# Patient Record
Sex: Male | Born: 1990 | Race: White | Hispanic: No | Marital: Single | Smoking: Never smoker
Health system: Southern US, Community
[De-identification: ages and names within clinical notes are randomized; demographics above are authoritative.]

## PROBLEM LIST (undated history)

## (undated) DIAGNOSIS — I4819 Other persistent atrial fibrillation: Secondary | ICD-10-CM

## (undated) DIAGNOSIS — K5190 Ulcerative colitis, unspecified, without complications: Secondary | ICD-10-CM

## (undated) DIAGNOSIS — F1110 Opioid abuse, uncomplicated: Secondary | ICD-10-CM

## (undated) HISTORY — DX: Other persistent atrial fibrillation: I48.19

---

## 2011-12-18 ENCOUNTER — Emergency Department (HOSPITAL_COMMUNITY): Payer: Worker's Compensation

## 2011-12-18 ENCOUNTER — Encounter (HOSPITAL_COMMUNITY): Payer: Self-pay | Admitting: Physical Medicine and Rehabilitation

## 2011-12-18 ENCOUNTER — Emergency Department (HOSPITAL_COMMUNITY)
Admission: EM | Admit: 2011-12-18 | Discharge: 2011-12-18 | Disposition: A | Discharge status: Home / Self Care | Payer: Worker's Compensation | Attending: Emergency Medicine | Admitting: Emergency Medicine

## 2011-12-18 DIAGNOSIS — Y939 Activity, unspecified: Secondary | ICD-10-CM | POA: Insufficient documentation

## 2011-12-18 DIAGNOSIS — Y929 Unspecified place or not applicable: Secondary | ICD-10-CM | POA: Insufficient documentation

## 2011-12-18 DIAGNOSIS — Z79899 Other long term (current) drug therapy: Secondary | ICD-10-CM | POA: Insufficient documentation

## 2011-12-18 DIAGNOSIS — F1210 Cannabis abuse, uncomplicated: Secondary | ICD-10-CM | POA: Insufficient documentation

## 2011-12-18 DIAGNOSIS — K5190 Ulcerative colitis, unspecified, without complications: Secondary | ICD-10-CM | POA: Insufficient documentation

## 2011-12-18 DIAGNOSIS — S81009A Unspecified open wound, unspecified knee, initial encounter: Secondary | ICD-10-CM | POA: Insufficient documentation

## 2011-12-18 DIAGNOSIS — S81019A Laceration without foreign body, unspecified knee, initial encounter: Secondary | ICD-10-CM

## 2011-12-18 DIAGNOSIS — X58XXXA Exposure to other specified factors, initial encounter: Secondary | ICD-10-CM | POA: Insufficient documentation

## 2011-12-18 HISTORY — DX: Opioid abuse, uncomplicated: F11.10

## 2011-12-18 HISTORY — DX: Ulcerative colitis, unspecified, without complications: K51.90

## 2011-12-18 MED ORDER — TRAMADOL HCL 50 MG PO TABS: 50.0000 mg | ORAL_TABLET | Freq: Four times a day (QID) | ORAL | Status: DC | PRN

## 2011-12-18 NOTE — ED Notes (Addendum)
Pt presents to department for evaluation of L knee laceration. States he struck knee on metal cart while at work today. 2cm laceration noted upon arrival, bleeding controlled. 2/10 pain. Ambulatory to triage. Pt is conscious alert and oriented x4. No signs of distress noted at the time. Pt requesting urine drug screen for work.

## 2011-12-18 NOTE — ED Notes (Signed)
Patient transported to X-ray 

## 2011-12-18 NOTE — ED Notes (Signed)
Lab tech at bedside for Urine drug screen

## 2011-12-18 NOTE — ED Notes (Signed)
Neosporin, sterile dressing and kerlix to knee with instructions for care.

## 2011-12-18 NOTE — ED Provider Notes (Signed)
History   This chart was scribed for [employer]. Rubin Payor, MD by Sofie Rower. The patient was seen in room TR10C/TR10C and the patient's care was started at 3:15PM.     CSN: [id number]  Arrival date & time 12/18/11  1441   First MD Initiated Contact with Patient 12/18/11 1515      Chief Complaint  Patient presents with  . Laceration    (Consider location/radiation/quality/duration/timing/severity/associated sxs/prior treatment) Patient is a 21 y.o. male presenting with skin laceration. The history is provided by the patient. No language interpreter was used.  Laceration     Don Sauvageau is a 21 y.o. male , with a hx of narcotic abuse and ulcerative colitis, who presents to the Emergency Department complaining of sudden, moderate, laceration located at the left knee, onset today (12/18/11).  Associated symptoms include knee pain located at the left knee. Modifying factors include certain movements and positions of the left knee which intensify the knee pain.   The pt denies any other health problems at present and believes his most recent tetanus shot was within the last 10 years.   The pt does not smoke or drink alcohol.   Past Medical History  Diagnosis Date  . Narcotic abuse   . Ulcerative colitis     No past surgical history on file.  History reviewed. No pertinent family history.  History  Substance Use Topics  . Smoking status: Never Smoker   . Smokeless tobacco: Not on file  . Alcohol Use: No      Review of Systems  All other systems reviewed and are negative.    Allergies  Review of patient's allergies indicates no known allergies.  Home Medications   Current Outpatient Rx  Name Route Sig Dispense Refill  . CITALOPRAM HYDROBROMIDE 40 MG PO TABS Oral Take 40 mg by mouth daily.    Marland Kitchen MESALAMINE 1000 MG RE SUPP Rectal Place 1,000 mg rectally at bedtime.    . TRAMADOL HCL 50 MG PO TABS Oral Take 1 tablet (50 mg total) by mouth every 6 (six) hours as  needed for pain. 10 tablet 0    BP 136/68  Pulse 61  Temp 98.4 F (36.9 C) (Oral)  Resp 18  SpO2 98%  Physical Exam  Nursing note and vitals reviewed. Constitutional: He is oriented to person, place, and time. He appears well-developed and well-nourished.  HENT:  Head: Atraumatic.  Nose: Nose normal.  Eyes: Conjunctivae normal and EOM are normal.  Neck: Normal range of motion.  Cardiovascular: Normal rate.   Pulmonary/Chest: Effort normal.  Musculoskeletal: Normal range of motion.       Left knee: He exhibits no deformity.       2 cm laceration over proximal patella. ROM intact. No crepitus, no deformity. Neurovascularly intact distally.   Neurological: He is alert and oriented to person, place, and time.  Skin: Skin is warm and dry.  Psychiatric: He has a normal mood and affect. His behavior is normal.    ED Course  Procedures (including critical care time)    COORDINATION OF CARE:    3:21 PM- Treatment plan concerning x-ray of left knee and laceration repair discussed with patient. Pt agrees with treatment.   3:40 PM- Recheck. Laceration repair. Treatment plan discussed with patient. Pt agrees with treatment.   LACERATION REPAIR PROCEDURE NOTE The patient's identification was confirmed and consent was obtained. This procedure was performed by Juliet Rude. Rubin Payor, MD at 3:40 PM. Site: Left knee.  Sterile procedures  observed: Betadine.  Anesthetic used (type and amt): 2 % lidocaine with epinephrine, 2 cc Suture type/size: 3-0 proline Length: 2 cm # of Sutures: 3 Technique: Simple, interrupted.  Complexity: Simple.  Antibx ointment appliedbacitracin Tetanus UTD  Site anesthetized, irrigated with NS, explored without evidence of foreign body, wound well approximated, site covered with dry, sterile dressing.  Patient tolerated procedure well without complications. Instructions for care discussed verbally and patient provided with additional written instructions  for homecare and f/u.     Labs Reviewed - No data to display No results found for this or any previous visit. Dg Knee 2 Views Left  12/18/2011  *RADIOLOGY REPORT*  Clinical Data: Laceration to left knee  LEFT KNEE - 1-2 VIEW  Comparison: None.  Findings: No fracture or dislocation is seen.  Possible mild deformity of the proximal fibular shaft, correlate for prior history of trauma.  The joint spaces are preserved.  Visualized soft tissues are grossly unremarkable.  No suprapatellar knee joint effusion.  No radiopaque foreign body is seen.  IMPRESSION: No fracture, dislocation, or radiopaque foreign body is seen.   Original Report Authenticated By: Charline Bills, M.D.       1. Knee laceration       MDM  Patient the laceration. No clear intra-articular involvement. Tendon appears intact. Wound was closed.      I personally performed the services described in this documentation, which was scribed in my presence. The recorded information has been reviewed and considered.     Juliet Rude. Rubin Payor, MD 12/18/11 [phone]

## 2011-12-18 NOTE — ED Notes (Signed)
MD at bedside, suture cart set up

## 2014-03-09 IMAGING — CR DG KNEE 1-2V*L*
2 series · 2 of 2 positions shown · non-contrast
Comparison: None.

CLINICAL DATA: Laceration to left knee

LEFT KNEE - 1-2 VIEW

[t knee ap left]
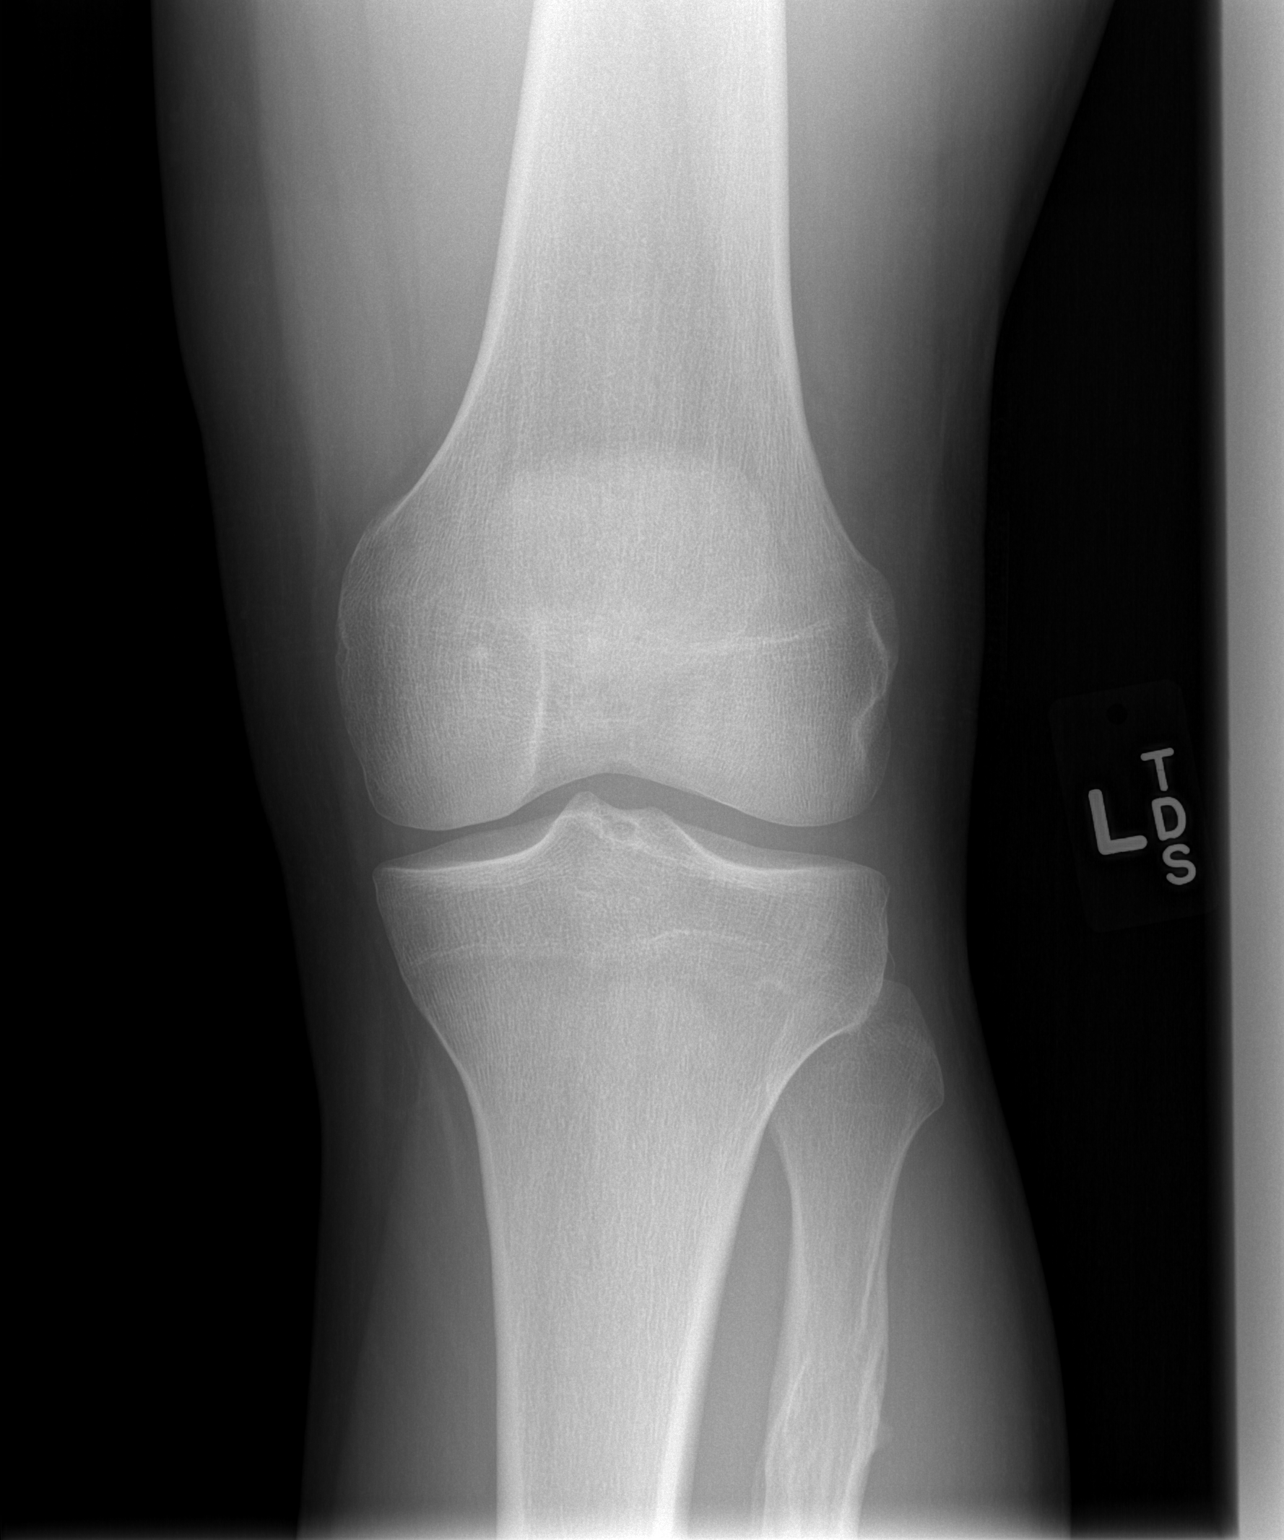

[t knee lat left]
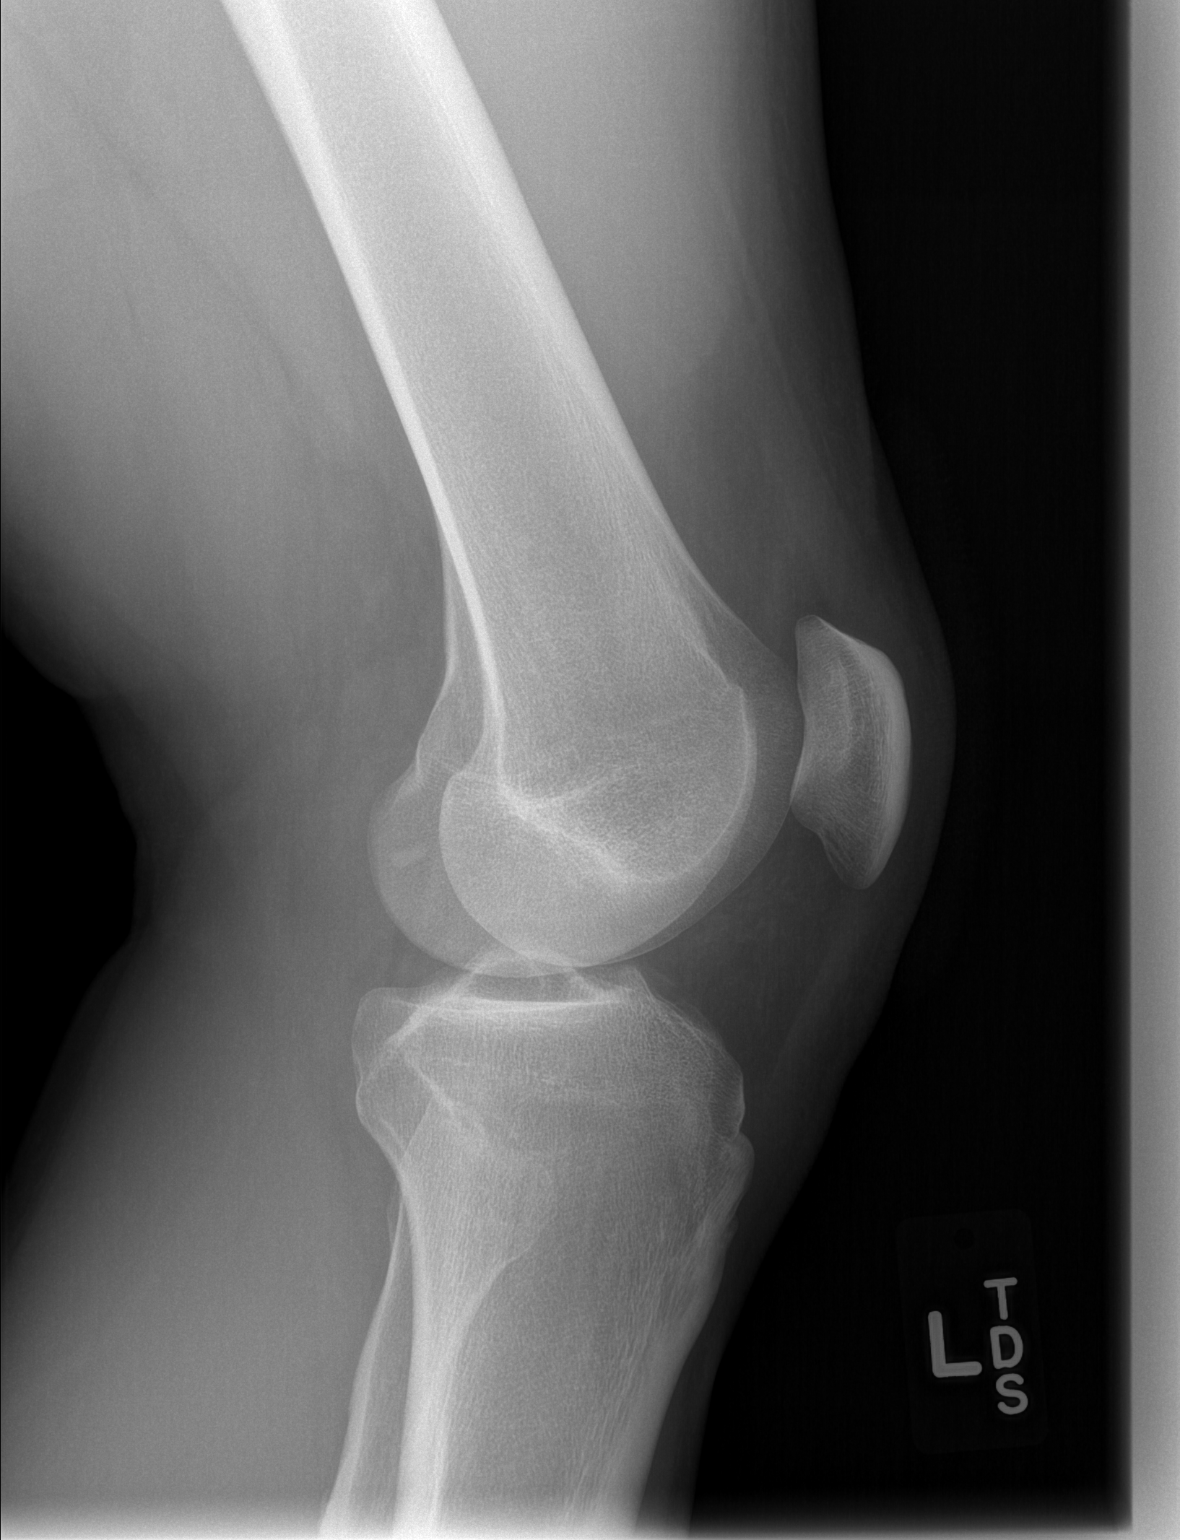

[2 of 2 positions shown; findings below may reference images not displayed]

FINDINGS: No fracture or dislocation is seen.

Possible mild deformity of the proximal fibular shaft, correlate
for prior history of trauma.

The joint spaces are preserved.

Visualized soft tissues are grossly unremarkable.

No suprapatellar knee joint effusion.

No radiopaque foreign body is seen.
IMPRESSION: No fracture, dislocation, or radiopaque foreign body is seen.

## 2015-02-09 ENCOUNTER — Telehealth: Payer: Self-pay | Admitting: Gastroenterology

## 2015-02-09 MED ORDER — MESALAMINE 1.2 G PO TBEC: 4.8000 g | DELAYED_RELEASE_TABLET | Freq: Every day | ORAL | Status: DC

## 2015-02-09 NOTE — Telephone Encounter (Signed)
Patient called requesting Lialda refills, he missed scheduling his f/u office visit with Dr Loreta AveMann and is currently out of his meds. Sent Rx for 2 weeks and advised patient to schedule a follow up appt

## 2015-12-25 ENCOUNTER — Encounter: Payer: Self-pay | Admitting: Internal Medicine

## 2015-12-25 ENCOUNTER — Encounter (INDEPENDENT_AMBULATORY_CARE_PROVIDER_SITE_OTHER): Payer: Self-pay

## 2015-12-25 ENCOUNTER — Ambulatory Visit (INDEPENDENT_AMBULATORY_CARE_PROVIDER_SITE_OTHER): Payer: 59 | Admitting: Internal Medicine

## 2015-12-25 VITALS — BP 104/60 | HR 58 | Ht 71.0 in | Wt 184.4 lb

## 2015-12-25 DIAGNOSIS — I4819 Other persistent atrial fibrillation: Secondary | ICD-10-CM

## 2015-12-25 DIAGNOSIS — I481 Persistent atrial fibrillation: Secondary | ICD-10-CM | POA: Diagnosis not present

## 2015-12-25 DIAGNOSIS — I4891 Unspecified atrial fibrillation: Secondary | ICD-10-CM | POA: Diagnosis not present

## 2015-12-25 MED ORDER — APIXABAN 5 MG PO TABS: 5.0000 mg | ORAL_TABLET | Freq: Two times a day (BID) | ORAL | 6 refills | Status: DC

## 2015-12-25 MED ORDER — APIXABAN 5 MG PO TABS: 5.0000 mg | ORAL_TABLET | Freq: Two times a day (BID) | ORAL | 6 refills | Status: AC

## 2015-12-25 NOTE — Progress Notes (Signed)
      HPI Mr. Casey Leach presents today for evaluation of atrial fibrillation. He is a pleasant 25 yo man who has no family h/o atrial fib or cardiac disease who was seen for a cold by a the student health MD at [employer] [employer]. The patient was found to be in atrial fib with a controlled VR. He does not have much in the way of symptoms. He has minimal palpitations. The patient relates that he has been consuming a "ketogenic diet" which includes about 80% fat mostly in the form of avocados.  No Known Allergies   Current Outpatient Prescriptions  Medication Sig Dispense Refill  . FLUoxetine (PROZAC) 20 MG capsule Take 1 capsule by mouth daily.  2  . loratadine (CLARITIN) 10 MG tablet Take 10 mg by mouth daily.    . mesalamine (LIALDA) 1.2 g EC tablet Take 1.2 g by mouth daily with breakfast.    . apixaban (ELIQUIS) 5 MG TABS tablet Take 1 tablet (5 mg total) by mouth 2 (two) times daily. 60 tablet 6   No current facility-administered medications for this visit.      Past Medical History:  Diagnosis Date  . Narcotic abuse   . Ulcerative colitis (HCC)     ROS:   All systems reviewed and negative except as noted in the HPI.   History reviewed. No pertinent surgical history.   Family History  Problem Relation Age of Onset  . Hypertension Mother      Social History   Social History  . Marital status: Single    Spouse name: N/A  . Number of children: N/A  . Years of education: N/A   Occupational History  . Not on file.   Social History Main Topics  . Smoking status: Never Smoker  . Smokeless tobacco: Never Used  . Alcohol use No  . Drug use: No  . Sexual activity: Yes    Birth control/ protection: Condom   Other Topics Concern  . Not on file   Social History Narrative  . No narrative on file     BP 104/60   Pulse (!) 58   Ht 5\' 11"  (1.803 m)   Wt 184 lb 6.4 oz (83.6 kg)   SpO2 98%   BMI 25.72 kg/m   Physical Exam:  Well appearing NAD HEENT:  Unremarkable Neck:  No JVD, no thyromegally Lymphatics:  No adenopathy Back:  No CVA tenderness Lungs:  Clear with no wheezes HEART:  IRegular rate rhythm, no murmurs, no rubs, no clicks Abd:  soft, positive bowel sounds, no organomegally, no rebound, no guarding Ext:  2 plus pulses, no edema, no cyanosis, no clubbing Skin:  No rashes no nodules Neuro:  CN II through XII intact, motor grossly intact  EKG - atrial fib with a controlled VR  Assess/Plan: 1. New onset atrial fib - the etiology is unclear. It is unusual to see atrial fib in such a young patient with no underlying heart disease. Will plan to obtain a 2D echo, and initiate systemic anti-coagulation in preparation for DCCV. I wonder if there is any correlation to his ketogenic diet. I have asked him to consume a low fat diet with protein and complex carbohydrates. I discussed the treatment options with the patient.   Leonia ReevesGregg Taylor,M.D.

## 2015-12-25 NOTE — Patient Instructions (Addendum)
Medication Instructions:  Your physician has recommended you make the following change in your medication:  1) START Eliquis 5 mg twice daily   Labwork: None Ordered   Testing/Procedures: Your physician has requested that you have an echocardiogram. Echocardiography is a painless test that uses sound waves to create images of your heart. It provides your doctor with information about the size and shape of your heart and how well your heart's chambers and valves are working. This procedure takes approximately one hour. There are no restrictions for this procedure.  Day before Cardioversion, have nurse visit for EKG   Cardioversion in 3-4 weeks   Follow-Up: Your physician recommends that you schedule a follow-up appointment in: 6-8 weeks with Dr. Ladona Ridgelaylor   Any Other Special Instructions Will Be Listed Below (If Applicable). Maintain low fat diet, as discussed    If you need a refill on your cardiac medications before your next appointment, please call your pharmacy.

## 2016-01-07 ENCOUNTER — Telehealth: Payer: Self-pay

## 2016-01-07 NOTE — Telephone Encounter (Signed)
Called pt. Left voice message. Pt scheduled for cardioversion for 01/22/16 [facility]. Explained I would be back in touch after speaking with Dr. Ladona Ridgelaylor, concerning holding Eliquis, and I also need to review inform of pre-procedure lab work needed.

## 2016-01-15 ENCOUNTER — Ambulatory Visit (HOSPITAL_COMMUNITY): Payer: 59 | Attending: Internal Medicine

## 2016-01-15 ENCOUNTER — Other Ambulatory Visit: Payer: Self-pay

## 2016-01-15 DIAGNOSIS — I4891 Unspecified atrial fibrillation: Secondary | ICD-10-CM

## 2016-01-15 DIAGNOSIS — I340 Nonrheumatic mitral (valve) insufficiency: Secondary | ICD-10-CM | POA: Diagnosis not present

## 2016-01-15 DIAGNOSIS — R002 Palpitations: Secondary | ICD-10-CM | POA: Diagnosis not present

## 2016-01-15 DIAGNOSIS — F1110 Opioid abuse, uncomplicated: Secondary | ICD-10-CM | POA: Diagnosis not present

## 2016-01-15 NOTE — Telephone Encounter (Signed)
Pt instructions for Cardioversion  Report to [facility] on 01/22/16 at 10:30 AM.  Nothing to eat or drink after midnight the night prior to the procedure  You may take your medication with a sip of water the morning of the procedure  You will need someone to drive you home after the procedure  Follow-up with Dr. Ladona Ridgelaylor 4 weeks after 01/22/16.

## 2016-01-15 NOTE — Telephone Encounter (Signed)
Called, left voice message to call back. 

## 2016-01-16 ENCOUNTER — Encounter: Payer: Self-pay | Admitting: Internal Medicine

## 2016-01-16 NOTE — Telephone Encounter (Signed)
Pt returning your call

## 2016-01-17 NOTE — Telephone Encounter (Signed)
Call Documentation   Doreatha MassedDebra W Moore at 01/16/2016 2:17 PM   Status: Signed    Pt returning your call.

## 2016-01-17 NOTE — Telephone Encounter (Signed)
This encounter was created in error - please disregard.

## 2016-01-18 NOTE — Telephone Encounter (Signed)
Called, spoke with pt. Reviewed pt cardioversion instructions. Informed to continue Eliquis - do not hold. Reminded of appt for EKG on 01/21/16 prior to cardioversion. Pt verbalized understanding and agreed with plan.

## 2016-01-21 ENCOUNTER — Telehealth: Payer: Self-pay

## 2016-01-21 ENCOUNTER — Ambulatory Visit (INDEPENDENT_AMBULATORY_CARE_PROVIDER_SITE_OTHER): Payer: 59

## 2016-01-21 DIAGNOSIS — I4891 Unspecified atrial fibrillation: Secondary | ICD-10-CM | POA: Diagnosis not present

## 2016-01-21 DIAGNOSIS — Z01812 Encounter for preprocedural laboratory examination: Secondary | ICD-10-CM

## 2016-01-21 NOTE — Progress Notes (Signed)
Patient came in for EKG to check his rhythm before his cardioversion tomorrow. Patient's EKG shows Atrial Fibrillation. Dr. Ladona Ridgelaylor reviewed and signed off on EKG. Patient given a copy of his instructions for his cardioversion tomorrow. Patient verbalized understanding.  Patient will need a set of labs done before procedure. Will have done tomorrow before procedure. Lab orders already in. Patient will go over to short stay early at 10:00 to have lab work done. Patient can not have done today due to work.

## 2016-01-21 NOTE — Patient Instructions (Addendum)
Keep your procedure appointment for Cardioversion for tomorrow at 10:00 am. You have been given a copy of your instructions today. Please make sure you take your Eliquis tomorrow morning before procedure.

## 2016-01-22 ENCOUNTER — Encounter (HOSPITAL_COMMUNITY): Payer: Self-pay

## 2016-01-22 ENCOUNTER — Other Ambulatory Visit: Payer: Self-pay

## 2016-01-22 ENCOUNTER — Ambulatory Visit (HOSPITAL_COMMUNITY): Payer: 59 | Admitting: Anesthesiology

## 2016-01-22 ENCOUNTER — Encounter (HOSPITAL_COMMUNITY)
Admission: RE | Disposition: A | Discharge status: Home / Self Care | Payer: Self-pay | Source: Ambulatory Visit | Attending: Cardiology

## 2016-01-22 ENCOUNTER — Ambulatory Visit (HOSPITAL_COMMUNITY)
Admission: RE | Admit: 2016-01-22 | Discharge: 2016-01-22 | Disposition: A | Discharge status: Home / Self Care | Payer: 59 | Source: Ambulatory Visit | Attending: Cardiology | Admitting: Cardiology

## 2016-01-22 DIAGNOSIS — Z8249 Family history of ischemic heart disease and other diseases of the circulatory system: Secondary | ICD-10-CM | POA: Diagnosis not present

## 2016-01-22 DIAGNOSIS — I480 Paroxysmal atrial fibrillation: Secondary | ICD-10-CM | POA: Insufficient documentation

## 2016-01-22 DIAGNOSIS — K5190 Ulcerative colitis, unspecified, without complications: Secondary | ICD-10-CM | POA: Diagnosis not present

## 2016-01-22 DIAGNOSIS — Z79899 Other long term (current) drug therapy: Secondary | ICD-10-CM | POA: Insufficient documentation

## 2016-01-22 HISTORY — PX: CARDIOVERSION: SHX1299

## 2016-01-22 LAB — POCT I-STAT, CHEM 8
BUN: 22 mg/dL — ABNORMAL HIGH (ref 6–20)
CALCIUM ION: 1.14 mmol/L — AB (ref 1.15–1.40)
Chloride: 105 mmol/L (ref 101–111)
Creatinine, Ser: 1 mg/dL (ref 0.61–1.24)
Glucose, Bld: 84 mg/dL (ref 65–99)
HEMATOCRIT: 43 % (ref 39.0–52.0)
HEMOGLOBIN: 14.6 g/dL (ref 13.0–17.0)
Potassium: 4.1 mmol/L (ref 3.5–5.1)
SODIUM: 142 mmol/L (ref 135–145)
TCO2: 25 mmol/L (ref 0–100)

## 2016-01-22 SURGERY — CARDIOVERSION
Anesthesia: General

## 2016-01-22 MED ORDER — SODIUM CHLORIDE 0.9 % IV SOLN
INTRAVENOUS | Status: DC
  Administered 2016-01-22: 500 mL via INTRAVENOUS

## 2016-01-22 MED ORDER — PROPOFOL 10 MG/ML IV BOLUS
INTRAVENOUS | Status: DC | PRN
  Administered 2016-01-22: 230 mg via INTRAVENOUS

## 2016-01-22 MED ORDER — LIDOCAINE HCL (CARDIAC) 20 MG/ML IV SOLN
INTRAVENOUS | Status: DC | PRN
  Administered 2016-01-22: 75 mg via INTRATRACHEAL

## 2016-01-22 NOTE — Transfer of Care (Signed)
Immediate Anesthesia Transfer of Care Note  Patient: Casey Leach  Procedure(s) Performed: Procedure(s): CARDIOVERSION (N/A)  Patient Location: PACU  Anesthesia Type:General  Level of Consciousness: awake, alert , oriented and patient cooperative  Airway & Oxygen Therapy: Patient Spontanous Breathing and Patient connected to nasal cannula oxygen  Post-op Assessment: Report given to RN, Post -op Vital signs reviewed and stable and Patient moving all extremities  Post vital signs: Reviewed and stable  Last Vitals:  Vitals:   01/22/16 1020  BP: 136/75  Pulse: 71  Resp: 12  Temp: 36.5 C    Last Pain:  Vitals:   01/22/16 1020  TempSrc: Oral         Complications: No apparent anesthesia complications

## 2016-01-22 NOTE — Procedures (Signed)
Electrical Cardioversion Procedure Note Casey NeverMark Leach [id number] 02/28/1990  Procedure: Electrical Cardioversion Indications:  Atrial Fibrillation  Procedure Details Consent: Risks of procedure as well as the alternatives and risks of each were explained to the (patient/caregiver).  Consent for procedure obtained. Time Out: Verified patient identification, verified procedure, site/side was marked, verified correct patient position, special equipment/implants available, medications/allergies/relevent history reviewed, required imaging and test results available.  Performed  Patient placed on cardiac monitor, pulse oximetry, supplemental oxygen as necessary.  Sedation given: Pt sedated by anesthesia with lidocaine 75 mg and diprovan 230 mg IV. Pacer pads placed anterior and posterior chest.  Cardioverted 1 time(s).  Cardioverted at 120J.  Evaluation Findings: Post procedure EKG shows: NSR Complications: None Patient did tolerate procedure well.   Olga MillersBrian Crenshaw 01/22/2016, 12:05 PM

## 2016-01-22 NOTE — Discharge Instructions (Signed)
Electrical Cardioversion, Care After °This sheet gives you information about how to care for yourself after your procedure. Your health care provider may also give you more specific instructions. If you have problems or questions, contact your health care provider. °What can I expect after the procedure? °After the procedure, it is common to have: °· Some redness on the skin where the shocks were given. °Follow these instructions at home: °· Do not drive for 24 hours if you were given a medicine to help you relax (sedative). °· Take over-the-counter and prescription medicines only as told by your health care provider. °· Ask your health care provider how to check your pulse. Check it often. °· Rest for 48 hours after the procedure or as told by your health care provider. °· Avoid or limit your caffeine use as told by your health care provider. °Contact a health care provider if: °· You feel like your heart is beating too quickly or your pulse is not regular. °· You have a serious muscle cramp that does not go away. °Get help right away if: °· You have discomfort in your chest. °· You are dizzy or you feel faint. °· You have trouble breathing or you are short of breath. °· Your speech is slurred. °· You have trouble moving an arm or leg on one side of your body. °· Your fingers or toes turn cold or blue. °This information is not intended to replace advice given to you by your health care provider. Make sure you discuss any questions you have with your health care provider. °Document Released: 11/24/2012 Document Revised: 09/07/2015 Document Reviewed: 08/10/2015 °Elsevier Interactive Patient Education © 2017 [employer] Inc[employer] ° °

## 2016-01-22 NOTE — H&P (Signed)
Casey Leach  12/25/2015 2:45 PM  Office Visit  MRN:  [id number]  Description: Male DOB: 05/02/1990 Provider: Marinus MawGregg W Taylor, MD Department: [facility]  Vitals   BP  104/60     Pulse    58     Ht  5\' 11"  (1.803 m)     Wt  184 lb 6.4 oz (83.6 kg)     SpO2  98%      BMI  25.72 kg/m    Progress Notes   Marinus MawGregg W Taylor, MD at 12/25/2015 2:45 PM   Status: Signed         HPI Casey Leach presents today for evaluation of atrial fibrillation. He is a pleasant 25 yo man who has no family h/o atrial fib or cardiac disease who was seen for a cold by a the student health MD at [facility]. The patient was found to be in atrial fib with a controlled VR. He does not have much in the way of symptoms. He has minimal palpitations. The patient relates that he has been consuming a "ketogenic diet" which includes about 80% fat mostly in the form of avocados.  No Known Allergies         Current Outpatient Prescriptions  Medication Sig Dispense Refill  . FLUoxetine (PROZAC) 20 MG capsule Take 1 capsule by mouth daily.  2  . loratadine (CLARITIN) 10 MG tablet Take 10 mg by mouth daily.    . mesalamine (LIALDA) 1.2 g EC tablet Take 1.2 g by mouth daily with breakfast.    . apixaban (ELIQUIS) 5 MG TABS tablet Take 1 tablet (5 mg total) by mouth 2 (two) times daily. 60 tablet 6   No current facility-administered medications for this visit.          Past Medical History:  Diagnosis Date  . Narcotic abuse   . Ulcerative colitis (HCC)     ROS:   All systems reviewed and negative except as noted in the HPI.   History reviewed. No pertinent surgical history.        Family History  Problem Relation Age of Onset  . Hypertension Mother      Social History        Social History  . Marital status: Single    Spouse name: N/A  . Number of children: N/A  . Years of education: N/A      Occupational History  . Not on file.          Social History Main Topics  . Smoking status: Never Smoker  . Smokeless tobacco: Never Used  . Alcohol use No  . Drug use: No  . Sexual activity: Yes    Birth control/ protection: Condom       Other Topics Concern  . Not on file      Social History Narrative  . No narrative on file     BP 104/60   Pulse (!) 58   Ht 5\' 11"  (1.803 m)   Wt 184 lb 6.4 oz (83.6 kg)   SpO2 98%   BMI 25.72 kg/m   Physical Exam:  Well appearing NAD HEENT: Unremarkable Neck:  No JVD, no thyromegally Lymphatics:  No adenopathy Back:  No CVA tenderness Lungs:  Clear with no wheezes HEART:  IRegular rate rhythm, no murmurs, no rubs, no clicks Abd:  soft, positive bowel sounds, no organomegally, no rebound, no guarding Ext:  2 plus pulses, no edema, no cyanosis, no clubbing Skin:  No rashes no  nodules Neuro:  CN II through XII intact, motor grossly intact  EKG - atrial fib with a controlled VR  Assess/Plan: 1. New onset atrial fib - the etiology is unclear. It is unusual to see atrial fib in such a young patient with no underlying heart disease. Will plan to obtain a 2D echo, and initiate systemic anti-coagulation in preparation for DCCV. I wonder if there is any correlation to his ketogenic diet. I have asked him to consume a low fat diet with protein and complex carbohydrates. I discussed the treatment options with the patient.   Casey Leach,M.D.     For DCCV; no changes Olga MillersBrian Rasheed Welty

## 2016-01-23 ENCOUNTER — Encounter (HOSPITAL_COMMUNITY): Payer: Self-pay | Admitting: Cardiology

## 2016-01-23 NOTE — Anesthesia Postprocedure Evaluation (Signed)
Anesthesia Post Note  Patient: Casey Leach  Procedure(s) Performed: Procedure(s) (LRB): CARDIOVERSION (N/A)  Patient location during evaluation: Endoscopy Anesthesia Type: General Level of consciousness: awake Pain management: pain level controlled Vital Signs Assessment: post-procedure vital signs reviewed and stable Respiratory status: spontaneous breathing Cardiovascular status: stable Postop Assessment: no signs of nausea or vomiting Anesthetic complications: no    Last Vitals:  Vitals:   01/22/16 1230 01/22/16 1240  BP: (!) 120/58 121/64  Pulse: 70 64  Resp: 17 19  Temp:      Last Pain:  Vitals:   01/22/16 1225  TempSrc: Oral                 Greyson Peavy

## 2016-01-23 NOTE — Anesthesia Preprocedure Evaluation (Signed)
Anesthesia Evaluation  Patient identified by MRN, date of birth, ID band Patient awake    Reviewed: Allergy & Precautions, NPO status , Patient's Chart, lab work & pertinent test results  History of Anesthesia Complications Negative for: history of anesthetic complications  Airway Mallampati: II  TM Distance: >3 FB Neck ROM: Full    Dental  (+) Dental Advisory Given   Pulmonary neg pulmonary ROS,    breath sounds clear to auscultation       Cardiovascular + dysrhythmias  Rhythm:Irregular     Neuro/Psych negative neurological ROS     GI/Hepatic Neg liver ROS, PUD,   Endo/Other  negative endocrine ROS  Renal/GU negative Renal ROS     Musculoskeletal   Abdominal   Peds  Hematology negative hematology ROS (+)   Anesthesia Other Findings   Reproductive/Obstetrics                             Anesthesia Physical Anesthesia Plan  ASA: II  Anesthesia Plan: General   Post-op Pain Management:    Induction: Intravenous  Airway Management Planned: Mask  Additional Equipment: None  Intra-op Plan:   Post-operative Plan:   Informed Consent: I have reviewed the patients History and Physical, chart, labs and discussed the procedure including the risks, benefits and alternatives for the proposed anesthesia with the patient or authorized representative who has indicated his/her understanding and acceptance.   Dental advisory given  Plan Discussed with: CRNA and Surgeon  Anesthesia Plan Comments:         Anesthesia Quick Evaluation

## 2016-01-28 ENCOUNTER — Telehealth: Payer: Self-pay | Admitting: Internal Medicine

## 2016-01-28 DIAGNOSIS — Z79899 Other long term (current) drug therapy: Secondary | ICD-10-CM

## 2016-01-28 DIAGNOSIS — I4819 Other persistent atrial fibrillation: Secondary | ICD-10-CM

## 2016-01-28 NOTE — Telephone Encounter (Signed)
New Message  Pt call requesting to speak with RN about scheduling an Ablation. Please call back to discuss

## 2016-01-29 MED ORDER — FLECAINIDE ACETATE 100 MG PO TABS: 100.0000 mg | ORAL_TABLET | Freq: Two times a day (BID) | ORAL | 3 refills | Status: DC

## 2016-01-29 NOTE — Telephone Encounter (Signed)
Received incoming call from pt. Pt stated he his heart went out of rhythm on Saturday (12/9). Pt had cardioversion on 01/18/16. HR today is 80 and irregular. Forwarded information to Dr. Ladona Ridgelaylor to advise.  Dr. Ladona Ridgelaylor advised pt to start Flecainide 100 mg twice a day. Pt will STOP Prozac while taking Flecainide. Explained the drug warning with taking Flecainide with Prozac (prolonged QT/Torsdaes). Pt will need EKG in by end of this week or beginning of next week, and GXT with Dr. Ladona Ridgelaylor (should be scheduled for last week of December or 1st week of January 2018). Reviewed pt instructions r/t GXT. Pt verbalized understanding and agreed with plan.

## 2016-02-05 ENCOUNTER — Ambulatory Visit (INDEPENDENT_AMBULATORY_CARE_PROVIDER_SITE_OTHER): Payer: 59 | Admitting: *Deleted

## 2016-02-05 DIAGNOSIS — I481 Persistent atrial fibrillation: Secondary | ICD-10-CM | POA: Diagnosis not present

## 2016-02-05 DIAGNOSIS — I4819 Other persistent atrial fibrillation: Secondary | ICD-10-CM

## 2016-02-05 NOTE — Patient Instructions (Signed)
Your physician recommends that you continue on your current medications as directed. Please refer to the Current Medication list given to you today. PLEASE CONTINUE TO HOLD PROZAC WHILE TAKING FLECAINIDE.  Keep appointment for treadmill test with Dr. Ladona Ridgelaylor on January 3.

## 2016-02-05 NOTE — Progress Notes (Signed)
1.) Reason for visit: EKG  2.) Name of MD requesting visit: Dr. Ladona Ridgelaylor  3.) H&P: Pt had cardioversion on 01/18/16.  Reported went back to afib on 12/9 during a work out after using a pre workout supplement; pt was instructed to start Flecainide 100 mg bid and hold Prozac.  He is scheduled for GXT with Dr. Ladona Ridgelaylor on 02/20/16.  4.) ROS related to problem: Pt in afib today, HR 61, following med instructions  5.) Assessment and plan per MD: continue with plan for GXT per Dr. Ladona Ridgelaylor, if still afib at that time, will plan for cardioversion

## 2016-02-08 NOTE — Telephone Encounter (Signed)
New message   Pt verbalized that he is ready to schedule cardioversion  Procedure quickly after the next appt

## 2016-02-12 NOTE — Telephone Encounter (Signed)
error 

## 2016-02-15 ENCOUNTER — Encounter: Payer: Self-pay | Admitting: Internal Medicine

## 2016-02-15 NOTE — Telephone Encounter (Addendum)
Scheduled DCCV for 02/25/16 at 9:00am.  Will need to be at the hospital at 7:30am.  NPO after midnight and  will need someone to drive him home after procedure. Left message that he will have a BMP, CBC, and get his instructions on 02/20/16 when he comes for his GXT

## 2016-02-15 NOTE — Telephone Encounter (Signed)
Pt fu on status of scheduling Cardioversion-has ETT scheduled 02-20-16 with Ladona Ridgelaylor

## 2016-02-15 NOTE — Telephone Encounter (Signed)
Discussed with Dr Ladona Ridgelaylor and he would like for him to have his DCCV the week of 02/25/16, which is the week after his GXT.  I have left a voicemail for him with the above and asked he call back to give the date he would like to have the DCCV.  I will have this scheduled and call him back after with instructions

## 2016-02-15 NOTE — Telephone Encounter (Signed)
This encounter was created in error - please disregard.

## 2016-02-15 NOTE — Telephone Encounter (Signed)
New Message  Pt voiced the 8th would be fine and mornings preferably.  Please f/u with pt

## 2016-02-15 NOTE — Telephone Encounter (Signed)
Call Documentation   [name] at 02/15/2016 4:15 PM   Status: Signed    New Message  Pt voiced the 8th would be fine and mornings preferably.  Please f/u with pt

## 2016-02-20 ENCOUNTER — Ambulatory Visit: Payer: 59

## 2016-02-20 ENCOUNTER — Ambulatory Visit (INDEPENDENT_AMBULATORY_CARE_PROVIDER_SITE_OTHER): Payer: 59 | Admitting: Internal Medicine

## 2016-02-20 ENCOUNTER — Encounter: Payer: Self-pay | Admitting: Internal Medicine

## 2016-02-20 ENCOUNTER — Encounter (INDEPENDENT_AMBULATORY_CARE_PROVIDER_SITE_OTHER): Payer: Self-pay

## 2016-02-20 VITALS — BP 122/82 | HR 70 | Ht 70.0 in | Wt 187.4 lb

## 2016-02-20 DIAGNOSIS — I4891 Unspecified atrial fibrillation: Secondary | ICD-10-CM

## 2016-02-20 NOTE — Progress Notes (Signed)
HPI Mr. Casey Leach returns today for ongoing evaluation of atrial fibrillation. He is a pleasant 26 yo man who has no family h/o atrial fib or cardiac disease who was seen for a cold by a the student health [facility]. The patient was found to be in atrial fib with a controlled VR. He does not have much in the way of symptoms. He has minimal palpitations. He underwent DCCV and reverted back to atrial fib but thinks he felt better in NSR. He had an echo which showed normal LV function and a 39 mm left atrium. He is bothered by the atrial fib but it does not appear to limit him much physically. He was placed on flecainide. His ECG shows a QRS of 90. Still in atrial fib. No Known Allergies   Current Outpatient Prescriptions  Medication Sig Dispense Refill  . apixaban (ELIQUIS) 5 MG TABS tablet Take 1 tablet (5 mg total) by mouth 2 (two) times daily. 60 tablet 6  . flecainide (TAMBOCOR) 100 MG tablet Take 1 tablet (100 mg total) by mouth 2 (two) times daily. 60 tablet 3  . loratadine (CLARITIN) 10 MG tablet Take 10 mg by mouth daily.    . mesalamine (LIALDA) 1.2 g EC tablet Take 1.2 g by mouth daily with breakfast.     No current facility-administered medications for this visit.      Past Medical History:  Diagnosis Date  . Narcotic abuse   . Ulcerative colitis (HCC)     ROS:   All systems reviewed and negative except as noted in the HPI.   Past Surgical History:  Procedure Laterality Date  . CARDIOVERSION N/A 01/22/2016   Procedure: CARDIOVERSION;  Surgeon: Lewayne BuntingBrian S Crenshaw, MD;  [facility];  Service: Cardiovascular;  Laterality: N/A;     Family History  Problem Relation Age of Onset  . Hypertension Mother      Social History   Social History  . Marital status: Single    Spouse name: N/A  . Number of children: N/A  . Years of education: N/A   Occupational History  . Not on file.   Social History Main Topics  . Smoking status: Never Smoker  .  Smokeless tobacco: Never Used  . Alcohol use No  . Drug use: No  . Sexual activity: Yes    Birth control/ protection: Condom   Other Topics Concern  . Not on file   Social History Narrative  . No narrative on file     BP 122/82   Pulse 70   Ht 5\' 10"  (1.778 m)   Wt 187 lb 6.4 oz (85 kg)   BMI 26.89 kg/m   Physical Exam:  Well appearing young man, NAD HEENT: Unremarkable Neck:  6 cm JVD, no thyromegally Lymphatics:  No adenopathy Back:  No CVA tenderness Lungs:  Clear with no wheezes HEART:  IRegular rate rhythm, no murmurs, no rubs, no clicks Abd:  soft, positive bowel sounds, no organomegally, no rebound, no guarding Ext:  2 plus pulses, no edema, no cyanosis, no clubbing Skin:  No rashes no nodules Neuro:  CN II through XII intact, motor grossly intact  EKG - atrial fib with a controlled VR  Assess/Plan: 1. Persistent atrial fib - he has many questions today. He is tolerating the Flecainide. He has not reverted to NSR. He will undergo repeat DCCV. He has a 39 mm LA.  If he will maintain NSR, I will refer him for  an atrial fib ablation.  2. Anti-coagulation - he has noted a little GI bleeding with the Eliquis. He has a diagnosis of Crohns. His CHADSVASC score is 0. Will plan to stop his blood thinner once he is back in NSR for 3 weeks.  Roxine Whittinghill,M.D.   Leonia Reeves.D.

## 2016-02-20 NOTE — Patient Instructions (Addendum)
Medication Instructions:  Your physician recommends that you continue on your current medications as directed. Please refer to the Current Medication list given to you today.   Labwork: TODAY: BMET / CBC w/ diff   Testing/Procedures: Cardioversion --- Scheduled for 02/25/16  EKG and Stress Test 2 weeks from 02/25/16 (if in a-fib, do not do stress test)  Special Instructions: [facility] on 02/25/16 at 7:30 AM.   Nothing to eat or drink after midnight the night prior to the procedure   You may take your medication with a sip of water the morning of the procedure   You will need someone to drive you home after the procedure   Follow-Up: Your physician recommends that you schedule a follow-up appointment in: 4 weeks with Dr. Elberta Fortisamnitz or Dr. Johney FrameAllred

## 2016-02-21 LAB — CBC WITH DIFFERENTIAL/PLATELET
BASOS: 1 %
Basophils Absolute: 0.1 10*3/uL (ref 0.0–0.2)
EOS (ABSOLUTE): 0.2 10*3/uL (ref 0.0–0.4)
EOS: 3 %
HEMATOCRIT: 46.1 % (ref 37.5–51.0)
Hemoglobin: 15.8 g/dL (ref 13.0–17.7)
IMMATURE GRANS (ABS): 0 10*3/uL (ref 0.0–0.1)
IMMATURE GRANULOCYTES: 0 %
LYMPHS: 21 %
Lymphocytes Absolute: 1.2 10*3/uL (ref 0.7–3.1)
MCH: 31.7 pg (ref 26.6–33.0)
MCHC: 34.3 g/dL (ref 31.5–35.7)
MCV: 93 fL (ref 79–97)
Monocytes Absolute: 0.5 10*3/uL (ref 0.1–0.9)
Monocytes: 9 %
NEUTROS PCT: 66 %
Neutrophils Absolute: 3.6 10*3/uL (ref 1.4–7.0)
PLATELETS: 260 10*3/uL (ref 150–379)
RBC: 4.98 x10E6/uL (ref 4.14–5.80)
RDW: 12.8 % (ref 12.3–15.4)
WBC: 5.6 10*3/uL (ref 3.4–10.8)

## 2016-02-21 LAB — BASIC METABOLIC PANEL
BUN/Creatinine Ratio: 21 — ABNORMAL HIGH (ref 9–20)
BUN: 22 mg/dL — AB (ref 6–20)
CALCIUM: 9.5 mg/dL (ref 8.7–10.2)
CO2: 25 mmol/L (ref 18–29)
CREATININE: 1.06 mg/dL (ref 0.76–1.27)
Chloride: 100 mmol/L (ref 96–106)
GFR calc Af Amer: 111 mL/min/{1.73_m2} (ref >59)
GFR, EST NON AFRICAN AMERICAN: 96 mL/min/{1.73_m2} (ref >59)
GLUCOSE: 83 mg/dL (ref 65–99)
Potassium: 4.4 mmol/L (ref 3.5–5.2)
SODIUM: 141 mmol/L (ref 134–144)

## 2016-02-24 NOTE — Anesthesia Preprocedure Evaluation (Addendum)
Anesthesia Evaluation  Patient identified by MRN, date of birth, ID band Patient awake    Reviewed: Allergy & Precautions, H&P , NPO status , Patient's Chart, lab work & pertinent test results  Airway Mallampati: I  TM Distance: >3 FB Neck ROM: Full    Dental no notable dental hx. (+) Teeth Intact, Dental Advisory Given   Pulmonary neg pulmonary ROS,    Pulmonary exam normal breath sounds clear to auscultation       Cardiovascular + dysrhythmias Atrial Fibrillation  Rhythm:Irregular Rate:Normal     Neuro/Psych negative neurological ROS  negative psych ROS   GI/Hepatic PUD, (+)     substance abuse  ,   Endo/Other  negative endocrine ROS  Renal/GU negative Renal ROS  negative genitourinary   Musculoskeletal negative musculoskeletal ROS (+)   Abdominal   Peds negative pediatric ROS (+)  Hematology negative hematology ROS (+)   Anesthesia Other Findings   Reproductive/Obstetrics negative OB ROS                           Anesthesia Physical Anesthesia Plan  ASA: III  Anesthesia Plan: General   Post-op Pain Management:    Induction: Intravenous  Airway Management Planned: Mask  Additional Equipment:   Intra-op Plan:   Post-operative Plan:   Informed Consent: I have reviewed the patients History and Physical, chart, labs and discussed the procedure including the risks, benefits and alternatives for the proposed anesthesia with the patient or authorized representative who has indicated his/her understanding and acceptance.   Dental advisory given  Plan Discussed with: CRNA  Anesthesia Plan Comments:         Anesthesia Quick Evaluation

## 2016-02-25 ENCOUNTER — Encounter (HOSPITAL_COMMUNITY)
Admission: RE | Disposition: A | Discharge status: Home / Self Care | Payer: Self-pay | Source: Ambulatory Visit | Attending: Cardiology

## 2016-02-25 ENCOUNTER — Ambulatory Visit (HOSPITAL_COMMUNITY)
Admission: RE | Admit: 2016-02-25 | Discharge: 2016-02-25 | Disposition: A | Discharge status: Home / Self Care | Payer: 59 | Source: Ambulatory Visit | Attending: Cardiology | Admitting: Cardiology

## 2016-02-25 ENCOUNTER — Encounter (HOSPITAL_COMMUNITY): Payer: Self-pay | Admitting: *Deleted

## 2016-02-25 ENCOUNTER — Ambulatory Visit (HOSPITAL_COMMUNITY): Payer: 59 | Admitting: Anesthesiology

## 2016-02-25 DIAGNOSIS — I4891 Unspecified atrial fibrillation: Secondary | ICD-10-CM | POA: Insufficient documentation

## 2016-02-25 DIAGNOSIS — Z79899 Other long term (current) drug therapy: Secondary | ICD-10-CM | POA: Diagnosis not present

## 2016-02-25 HISTORY — PX: CARDIOVERSION: SHX1299

## 2016-02-25 SURGERY — CARDIOVERSION
Anesthesia: General

## 2016-02-25 MED ORDER — PROPOFOL 10 MG/ML IV BOLUS
INTRAVENOUS | Status: AC
  Filled 2016-02-25: qty 20

## 2016-02-25 MED ORDER — PROPOFOL 10 MG/ML IV BOLUS
INTRAVENOUS | Status: DC | PRN
  Administered 2016-02-25: 100 mg via INTRAVENOUS

## 2016-02-25 MED ORDER — LIDOCAINE 2% (20 MG/ML) 5 ML SYRINGE
INTRAMUSCULAR | Status: DC | PRN
  Administered 2016-02-25: 60 mg via INTRAVENOUS

## 2016-02-25 MED ORDER — SODIUM CHLORIDE 0.9 % IV SOLN
INTRAVENOUS | Status: DC
  Administered 2016-02-25: 08:00:00 via INTRAVENOUS

## 2016-02-25 MED ORDER — LIDOCAINE 2% (20 MG/ML) 5 ML SYRINGE
INTRAMUSCULAR | Status: AC
  Filled 2016-02-25: qty 5

## 2016-02-25 NOTE — Interval H&P Note (Signed)
History and Physical Interval Note:  02/25/2016 9:07 AM  Casey Leach  has presented today for surgery, with the diagnosis of AFIB  The various methods of treatment have been discussed with the patient and family. After consideration of risks, benefits and other options for treatment, the patient has consented to  Procedure(s): CARDIOVERSION (N/A) as a surgical intervention .  The patient's history has been reviewed, patient examined, no change in status, stable for surgery.  I have reviewed the patient's chart and labs.  Questions were answered to the patient's satisfaction.     Allister Lessley [employer]

## 2016-02-25 NOTE — Anesthesia Postprocedure Evaluation (Signed)
Anesthesia Post Note  Patient: Casey Leach  Procedure(s) Performed: Procedure(s) (LRB): CARDIOVERSION (N/A)  Patient location during evaluation: PACU Anesthesia Type: General Level of consciousness: awake and alert Pain management: pain level controlled Vital Signs Assessment: post-procedure vital signs reviewed and stable Respiratory status: spontaneous breathing, nonlabored ventilation and respiratory function stable Cardiovascular status: blood pressure returned to baseline and stable Postop Assessment: no signs of nausea or vomiting Anesthetic complications: no       Last Vitals:  Vitals:   02/25/16 0911 02/25/16 0913  BP: (!) 146/91 (!) 146/91  Pulse: 63 63  Resp: 14 16  Temp:  36.7 C    Last Pain:  Vitals:   02/25/16 0913  TempSrc: Oral                 Keegan Bensch,W. EDMOND

## 2016-02-25 NOTE — Anesthesia Postprocedure Evaluation (Deleted)
Anesthesia Post Note  Patient: Casey Leach  Procedure(s) Performed: Procedure(s) (LRB): CARDIOVERSION (N/A)  Anesthesia Type: General       Last Vitals:  Vitals:   02/25/16 0911 02/25/16 0913  BP: (!) 146/91 (!) 146/91  Pulse: 63 63  Resp: 14 16  Temp:  36.7 C    Last Pain:  Vitals:   02/25/16 0913  TempSrc: Oral                 Maryna Yeagle,W. EDMOND

## 2016-02-25 NOTE — Discharge Instructions (Signed)
Electrical Cardioversion, Care After °This sheet gives you information about how to care for yourself after your procedure. Your health care provider may also give you more specific instructions. If you have problems or questions, contact your health care provider. °What can I expect after the procedure? °After the procedure, it is common to have: °· Some redness on the skin where the shocks were given. °Follow these instructions at home: °· Do not drive for 24 hours if you were given a medicine to help you relax (sedative). °· Take over-the-counter and prescription medicines only as told by your health care provider. °· Ask your health care provider how to check your pulse. Check it often. °· Rest for 48 hours after the procedure or as told by your health care provider. °· Avoid or limit your caffeine use as told by your health care provider. °Contact a health care provider if: °· You feel like your heart is beating too quickly or your pulse is not regular. °· You have a serious muscle cramp that does not go away. °Get help right away if: °· You have discomfort in your chest. °· You are dizzy or you feel faint. °· You have trouble breathing or you are short of breath. °· Your speech is slurred. °· You have trouble moving an arm or leg on one side of your body. °· Your fingers or toes turn cold or blue. °This information is not intended to replace advice given to you by your health care provider. Make sure you discuss any questions you have with your health care provider. °Document Released: 11/24/2012 Document Revised: 09/07/2015 Document Reviewed: 08/10/2015 °Elsevier Interactive Patient Education © 2017 [employer] Inc[employer] ° °

## 2016-02-25 NOTE — H&P (View-Only) (Signed)
HPI Mr. Casey Leach returns today for ongoing evaluation of atrial fibrillation. He is a pleasant 26 yo man who has no family h/o atrial fib or cardiac disease who was seen for a cold by a the student health MD at [facility]. The patient was found to be in atrial fib with a controlled VR. He does not have much in the way of symptoms. He has minimal palpitations. He underwent DCCV and reverted back to atrial fib but thinks he felt better in NSR. He had an echo which showed normal LV function and a 39 mm left atrium. He is bothered by the atrial fib but it does not appear to limit him much physically. He was placed on flecainide. His ECG shows a QRS of 90. Still in atrial fib. No Known Allergies   Current Outpatient Prescriptions  Medication Sig Dispense Refill  . apixaban (ELIQUIS) 5 MG TABS tablet Take 1 tablet (5 mg total) by mouth 2 (two) times daily. 60 tablet 6  . flecainide (TAMBOCOR) 100 MG tablet Take 1 tablet (100 mg total) by mouth 2 (two) times daily. 60 tablet 3  . loratadine (CLARITIN) 10 MG tablet Take 10 mg by mouth daily.    . mesalamine (LIALDA) 1.2 g EC tablet Take 1.2 g by mouth daily with breakfast.     No current facility-administered medications for this visit.      Past Medical History:  Diagnosis Date  . Narcotic abuse   . Ulcerative colitis (HCC)     ROS:   All systems reviewed and negative except as noted in the HPI.   Past Surgical History:  Procedure Laterality Date  . CARDIOVERSION N/A 01/22/2016   Procedure: CARDIOVERSION;  Surgeon: Lewayne BuntingBrian S Crenshaw, MD;  [facility];  Service: Cardiovascular;  Laterality: N/A;     Family History  Problem Relation Age of Onset  . Hypertension Mother      Social History   Social History  . Marital status: Single    Spouse name: N/A  . Number of children: N/A  . Years of education: N/A   Occupational History  . Not on file.   Social History Main Topics  . Smoking status: Never Smoker  .  Smokeless tobacco: Never Used  . Alcohol use No  . Drug use: No  . Sexual activity: Yes    Birth control/ protection: Condom   Other Topics Concern  . Not on file   Social History Narrative  . No narrative on file     BP 122/82   Pulse 70   Ht 5\' 10"  (1.778 m)   Wt 187 lb 6.4 oz (85 kg)   BMI 26.89 kg/m   Physical Exam:  Well appearing young man, NAD HEENT: Unremarkable Neck:  6 cm JVD, no thyromegally Lymphatics:  No adenopathy Back:  No CVA tenderness Lungs:  Clear with no wheezes HEART:  IRegular rate rhythm, no murmurs, no rubs, no clicks Abd:  soft, positive bowel sounds, no organomegally, no rebound, no guarding Ext:  2 plus pulses, no edema, no cyanosis, no clubbing Skin:  No rashes no nodules Neuro:  CN II through XII intact, motor grossly intact  EKG - atrial fib with a controlled VR  Assess/Plan: 1. Persistent atrial fib - he has many questions today. He is tolerating the Flecainide. He has not reverted to NSR. He will undergo repeat DCCV. He has a 39 mm LA.  If he will maintain NSR, I will refer him for  an atrial fib ablation.  2. Anti-coagulation - he has noted a little GI bleeding with the Eliquis. He has a diagnosis of Crohns. His CHADSVASC score is 0. Will plan to stop his blood thinner once he is back in NSR for 3 weeks.  Gregg Taylor,M.D.   Leonia Reeves.D.

## 2016-02-25 NOTE — Procedures (Signed)
Electrical Cardioversion Procedure Note Casey NeverMark Leach [id number] 07/18/1990  Procedure: Electrical Cardioversion Indications:  Atrial Fibrillation. He has not missed Xarelto doses.   Procedure Details Consent: Risks of procedure as well as the alternatives and risks of each were explained to the (patient/caregiver).  Consent for procedure obtained. Time Out: Verified patient identification, verified procedure, site/side was marked, verified correct patient position, special equipment/implants available, medications/allergies/relevent history reviewed, required imaging and test results available.  Performed  Patient placed on cardiac monitor, pulse oximetry, supplemental oxygen as necessary.  Sedation given: Propofol per anesthesiology Pacer pads placed anterior and posterior chest.  Cardioverted 1 time(s).  Cardioverted at 200J.  Evaluation Findings: Post procedure EKG shows: NSR Complications: None Patient did tolerate procedure well.   Casey Leach 02/25/2016, 9:12 AM

## 2016-02-25 NOTE — Transfer of Care (Signed)
Immediate Anesthesia Transfer of Care Note  Patient: Casey Leach  Procedure(s) Performed: Procedure(s): CARDIOVERSION (N/A)  Patient Location: Endoscopy Unit  Anesthesia Type:General  Level of Consciousness: awake, alert  and oriented  Airway & Oxygen Therapy: Patient Spontanous Breathing  Post-op Assessment: Report given to RN  Post vital signs: Reviewed and stable  Last Vitals:  Vitals:   02/25/16 0910 02/25/16 0911  BP:  (!) 146/91  Pulse: 72 63  Resp: 15 14  Temp:      Last Pain:  Vitals:   02/25/16 0748  TempSrc: Oral         Complications: No apparent anesthesia complications

## 2016-03-10 ENCOUNTER — Telehealth: Payer: Self-pay | Admitting: Internal Medicine

## 2016-03-10 NOTE — Telephone Encounter (Signed)
Returned call to patient.  He has not been seen by Dr Johney FrameAllred yet(Dr Ladona Ridgelaylor pt).  He feels it is in his best interest to stop the blood thinner and in Dr Lubertha Basqueaylor's note it says may do so 2 weeks after DCCV if still maintaining NSR.  I let him know I would discuss with Dr Johney FrameAllred but that he would have to be on the blood thinners for at least 4 weeks prior to the ablation if that is what he and Dr Johney FrameAllred decide is the plan.  I let him know I would call him back on wed

## 2016-03-10 NOTE — Telephone Encounter (Signed)
New Message     Has question on the Eliquis, has crohn disease procedure and its causing a lot of problems with the crohns disease

## 2016-03-13 NOTE — Telephone Encounter (Signed)
Discussed with Dr Johney FrameAllred and he says he usually keeps patients on blood thinners for 4 weeks but he has not seen this patient.  Patient will keep his appointment as schedule but is anxious to get the ablation scheduled and done so he can get off the medications.  Especially the blood thinners with his Crohn's disease and the side effects it has

## 2016-03-20 ENCOUNTER — Ambulatory Visit: Payer: 59 | Admitting: Internal Medicine

## 2016-03-24 ENCOUNTER — Ambulatory Visit (INDEPENDENT_AMBULATORY_CARE_PROVIDER_SITE_OTHER): Payer: 59 | Admitting: Internal Medicine

## 2016-03-24 ENCOUNTER — Encounter: Payer: Self-pay | Admitting: Internal Medicine

## 2016-03-24 VITALS — BP 132/60 | HR 60 | Ht 70.0 in | Wt 189.0 lb

## 2016-03-24 DIAGNOSIS — I481 Persistent atrial fibrillation: Secondary | ICD-10-CM | POA: Diagnosis not present

## 2016-03-24 DIAGNOSIS — I4891 Unspecified atrial fibrillation: Secondary | ICD-10-CM

## 2016-03-24 DIAGNOSIS — I4819 Other persistent atrial fibrillation: Secondary | ICD-10-CM

## 2016-03-24 NOTE — Progress Notes (Signed)
Electrophysiology Office Note   Date:  03/24/2016   ID:  Casey NeverMark Herandez, DOB 10/18/1990, MRN [id number]  Primary Electrophysiologist: Dr Ladona Ridgelaylor    CC: afib   History of Present Illness: Casey Leach is a 26 y.o. male who presents today for electrophysiology follow-up.   The patient has symptomatic persistent atrial fibrillation.  [facility] URI symptoms 11/17.  He was noted to have afib at that time.  He was mostly unaware.  He was evaluated by Dr Ladona Ridgelaylor and underwent cardioversion 01/22/16.  He reports significant improvement in fatigue and exercise tolerance post cardioversion.  Unfortunately he returned to afib in 3 days.  He was again evaluated by Dr Ladona Ridgelaylor and was placed on flecainide.  He was again cardioverted 02/25/16.  He reports again feeling "much better" with sinus rhythm.  After about 3 weeks, he returned to afib.  He reports that he noticed return of afib after exercise (20 minutes in recovery phase).   He has been in afib since that time. He is anticoagulated with eliquis.  He reports compliance with eliquis without interruption.  Today, he denies symptoms of palpitations, chest pain, shortness of breath, orthopnea, PND, lower extremity edema, claudication, dizziness, presyncope, syncope, bleeding, or neurologic sequela. The patient is tolerating medications without difficulties and is otherwise without complaint today.    Past Medical History:  Diagnosis Date  . Narcotic abuse   . Persistent atrial fibrillation (HCC)   . Ulcerative colitis [facility](HCC)    Past Surgical History:  Procedure Laterality Date  . CARDIOVERSION N/A 01/22/2016   Procedure: CARDIOVERSION;  Surgeon: Lewayne BuntingBrian S Crenshaw, MD;  [facility];  Service: Cardiovascular;  Laterality: N/A;  . CARDIOVERSION N/A 02/25/2016   Procedure: CARDIOVERSION;  Surgeon: Laurey Moralealton S McLean, MD;  [facility];  Service: Cardiovascular;  Laterality: N/A;     Current  Outpatient Prescriptions  Medication Sig Dispense Refill  . apixaban (ELIQUIS) 5 MG TABS tablet Take 1 tablet (5 mg total) by mouth 2 (two) times daily. 60 tablet 6  . loratadine (CLARITIN) 10 MG tablet Take 10 mg by mouth daily.    . mesalamine (LIALDA) 1.2 g EC tablet Take 1.2 g by mouth daily with breakfast.     No current facility-administered medications for this visit.     Allergies:   Patient has no known allergies.   Social History:  The patient  reports that he has never smoked. He has never used smokeless tobacco. He reports that he uses drugs, including Marijuana. He reports that he does not drink alcohol.   Family History:  The patient's  family history includes Hypertension in his mother.   Denies any FH of afib   ROS:  Please see the history of present illness.   All other systems are reviewed and negative.    PHYSICAL EXAM: VS:  BP 132/60   Pulse 60   Ht 5\' 10"  (1.778 m)   Wt 189 lb (85.7 kg)   BMI 27.12 kg/m  , BMI Body mass index is 27.12 kg/m. GEN: Well nourished, well developed, in no acute distress  HEENT: normal  Neck: no JVD, carotid bruits, or masses Cardiac: iRRR; no murmurs, rubs, or gallops,no edema  Respiratory:  clear to auscultation bilaterally, normal work of breathing GI: soft, nontender, nondistended, + BS MS: no deformity or atrophy  Skin: warm and dry  Neuro:  Strength and sensation are intact Psych: euthymic mood, full affect  EKG:  EKG is ordered  today. The ekg ordered today shows afib, V rate 60 bpm, QTc 406 msec   Recent Labs: 01/22/2016: Hemoglobin 14.6 02/20/2016: BUN 22; Creatinine, Ser 1.06; Platelets 260; Potassium 4.4; Sodium 141    Lipid Panel  No results found for: CHOL, TRIG, HDL, CHOLHDL, VLDL, LDLCALC, LDLDIRECT   Wt Readings from Last 3 Encounters:  03/24/16 189 lb (85.7 kg)  02/20/16 187 lb 6.4 oz (85 kg)  01/22/16 185 lb (83.9 kg)      Other studies Reviewed: Additional studies/ records that were reviewed  today include: Dr Bruna Potteraylors notes, echo 01/15/16  Review of the above records today demonstrates: mild LVH, EF 55%, trivial MR, mild LA enlargement   ASSESSMENT AND PLAN:  1.  Persistent afib The patient has symptomatic afib.  He has failed medical therapy with flecainide.  He is anticoagulated with eliquis. Therapeutic strategies for afib including medicine and ablation were discussed in detail with the patient today. Risk, benefits, and alternatives to EP study and radiofrequency ablation for afib were also discussed in detail today. These risks include but are not limited to stroke, bleeding, vascular damage, tamponade, perforation, damage to the esophagus, lungs, and other structures, pulmonary vein stenosis, worsening renal function, and death. The patient understands these risk and wishes to proceed.  We will therefore proceed with catheter ablation at the next available time.  Will plan TEE prior to ablation to exclude LAA thrombus.    Current medicines are reviewed at length with the patient today.   The patient does not have concerns regarding his medicines.  The following changes were made today:  none  Labs/ tests ordered today include:  Orders Placed This Encounter  Procedures  . Basic metabolic panel  . CBC with Differential  . EKG 12-Lead     Signed, Hillis RangeJames Nickalos Petersen, MD  03/24/2016    [facility] [address] [place] [address] [phone] (office) [phone] (fax)

## 2016-03-24 NOTE — Patient Instructions (Addendum)
  Medication Instructions:  Your physician has recommended you make the following change in your medication:  1) Stop Flecainide   Labwork: Your physician recommends that you return for lab work on 04/03/16 at 9am---do not have to fast   Testing/Procedures: Your physician has requested that you have a TEE. During a TEE, sound waves are used to create images of your heart. It provides your doctor with information about the size and shape of your heart and how well your heart's chambers and valves are working. In this test, a transducer is attached to the end of a flexible tube that's guided down your throat and into your esophagus (the tube leading from you mouth to your stomach) to get a more detailed image of your heart. You are not awake for the procedure. Please see the instruction sheet given to you today. For further information please visit [email or web address].---04/10/16   Your physician has recommended that you have an ablation. Catheter ablation is a medical procedure used to treat some cardiac arrhythmias (irregular heartbeats). During catheter ablation, a long, thin, flexible tube is put into a blood vessel in your groin (upper thigh), or neck. This tube is called an ablation catheter. It is then guided to your heart through the blood vessel. Radio frequency waves destroy small areas of heart tissue where abnormal heartbeats may cause an arrhythmia to start. Please see the instruction sheet given to you today.---04/10/16  Please [facility] at 7:30am Do not eat or drink after midnight the night prior to procedure Do not take any medications the morning of the procedure Plan for one night stay in hospital Will need someone to drive you home at discharge    Follow-Up: Your physician recommends that you schedule a follow-up appointment in: 4 weeks from 04/10/16 with Rudi Cocoonna Carroll, NP and 3 months from 04/10/16 with Dr Johney FrameAllred

## 2016-03-25 ENCOUNTER — Telehealth (HOSPITAL_COMMUNITY): Payer: Self-pay | Admitting: *Deleted

## 2016-03-25 NOTE — Telephone Encounter (Signed)
LMOM for pt to clbk to sched 4 week f/u from 04/10/2016 procedure

## 2016-03-31 ENCOUNTER — Telehealth (HOSPITAL_COMMUNITY): Payer: Self-pay | Admitting: *Deleted

## 2016-03-31 ENCOUNTER — Telehealth: Payer: Self-pay | Admitting: *Deleted

## 2016-03-31 NOTE — Telephone Encounter (Signed)
LMOM for pt to clbk to sched 4 week from 02/22 procedure.

## 2016-04-02 MED ORDER — FLECAINIDE ACETATE 100 MG PO TABS: 100.0000 mg | ORAL_TABLET | Freq: Two times a day (BID) | ORAL | 1 refills | Status: DC

## 2016-04-02 NOTE — Telephone Encounter (Signed)
Discussed with Dr Johney FrameAllred and call in his Flecainide 100 mg bid until his procedure.  Will address his Flecainide after procedure

## 2016-04-03 ENCOUNTER — Encounter (HOSPITAL_COMMUNITY): Payer: Self-pay | Admitting: *Deleted

## 2016-04-03 ENCOUNTER — Other Ambulatory Visit (INDEPENDENT_AMBULATORY_CARE_PROVIDER_SITE_OTHER): Payer: 59

## 2016-04-03 DIAGNOSIS — I4891 Unspecified atrial fibrillation: Secondary | ICD-10-CM | POA: Diagnosis not present

## 2016-04-04 LAB — BASIC METABOLIC PANEL
BUN/Creatinine Ratio: 20 (ref 9–20)
BUN: 19 mg/dL (ref 6–20)
CO2: 22 mmol/L (ref 18–29)
CREATININE: 0.93 mg/dL (ref 0.76–1.27)
Calcium: 9.1 mg/dL (ref 8.7–10.2)
Chloride: 100 mmol/L (ref 96–106)
GFR calc Af Amer: 131 mL/min/{1.73_m2} (ref >59)
GFR, EST NON AFRICAN AMERICAN: 113 mL/min/{1.73_m2} (ref >59)
GLUCOSE: 74 mg/dL (ref 65–99)
Potassium: 4.6 mmol/L (ref 3.5–5.2)
SODIUM: 142 mmol/L (ref 134–144)

## 2016-04-10 ENCOUNTER — Ambulatory Visit (HOSPITAL_BASED_OUTPATIENT_CLINIC_OR_DEPARTMENT_OTHER)
Admission: RE | Admit: 2016-04-10 | Discharge: 2016-04-10 | Disposition: A | Discharge status: Home / Self Care | Payer: 59 | Source: Ambulatory Visit | Attending: Nurse Practitioner | Admitting: Nurse Practitioner

## 2016-04-10 ENCOUNTER — Encounter (HOSPITAL_COMMUNITY)
Admission: RE | Disposition: A | Discharge status: Home / Self Care | Payer: Self-pay | Source: Ambulatory Visit | Attending: Internal Medicine

## 2016-04-10 ENCOUNTER — Ambulatory Visit (HOSPITAL_COMMUNITY): Payer: 59 | Admitting: Certified Registered Nurse Anesthetist

## 2016-04-10 ENCOUNTER — Encounter (HOSPITAL_COMMUNITY): Payer: Self-pay | Admitting: *Deleted

## 2016-04-10 ENCOUNTER — Ambulatory Visit (HOSPITAL_COMMUNITY)
Admission: RE | Admit: 2016-04-10 | Discharge: 2016-04-11 | Disposition: A | Discharge status: Home / Self Care | Payer: 59 | Source: Ambulatory Visit | Attending: Internal Medicine | Admitting: Internal Medicine

## 2016-04-10 DIAGNOSIS — K5190 Ulcerative colitis, unspecified, without complications: Secondary | ICD-10-CM | POA: Insufficient documentation

## 2016-04-10 DIAGNOSIS — I480 Paroxysmal atrial fibrillation: Secondary | ICD-10-CM | POA: Diagnosis not present

## 2016-04-10 DIAGNOSIS — Z8249 Family history of ischemic heart disease and other diseases of the circulatory system: Secondary | ICD-10-CM | POA: Diagnosis not present

## 2016-04-10 DIAGNOSIS — I4891 Unspecified atrial fibrillation: Secondary | ICD-10-CM

## 2016-04-10 DIAGNOSIS — I481 Persistent atrial fibrillation: Secondary | ICD-10-CM | POA: Insufficient documentation

## 2016-04-10 DIAGNOSIS — I4892 Unspecified atrial flutter: Secondary | ICD-10-CM

## 2016-04-10 DIAGNOSIS — Z7901 Long term (current) use of anticoagulants: Secondary | ICD-10-CM | POA: Insufficient documentation

## 2016-04-10 HISTORY — PX: TEE WITHOUT CARDIOVERSION: SHX5443

## 2016-04-10 HISTORY — PX: ATRIAL FIBRILLATION ABLATION: EP1191

## 2016-04-10 LAB — CBC
HCT: 43.2 % (ref 39.0–52.0)
Hemoglobin: 14.5 g/dL (ref 13.0–17.0)
MCH: 30.9 pg (ref 26.0–34.0)
MCHC: 33.6 g/dL (ref 30.0–36.0)
MCV: 92.1 fL (ref 78.0–100.0)
PLATELETS: 186 10*3/uL (ref 150–400)
RBC: 4.69 MIL/uL (ref 4.22–5.81)
RDW: 12.4 % (ref 11.5–15.5)
WBC: 4.9 10*3/uL (ref 4.0–10.5)

## 2016-04-10 LAB — POCT ACTIVATED CLOTTING TIME
ACTIVATED CLOTTING TIME: 219 s
ACTIVATED CLOTTING TIME: 246 s
ACTIVATED CLOTTING TIME: 279 s
Activated Clotting Time: 186 seconds

## 2016-04-10 SURGERY — ATRIAL FIBRILLATION ABLATION
Anesthesia: General

## 2016-04-10 SURGERY — ECHOCARDIOGRAM, TRANSESOPHAGEAL
Anesthesia: Moderate Sedation

## 2016-04-10 MED ORDER — HEPARIN SODIUM (PORCINE) 1000 UNIT/ML IJ SOLN
INTRAMUSCULAR | Status: DC | PRN
  Administered 2016-04-10 (×2): 5000 [IU] via INTRAVENOUS
  Administered 2016-04-10: 6000 [IU] via INTRAVENOUS

## 2016-04-10 MED ORDER — BUPIVACAINE HCL (PF) 0.25 % IJ SOLN
INTRAMUSCULAR | Status: AC
  Filled 2016-04-10: qty 30

## 2016-04-10 MED ORDER — FENTANYL CITRATE (PF) 100 MCG/2ML IJ SOLN
INTRAMUSCULAR | Status: AC
  Filled 2016-04-10: qty 2

## 2016-04-10 MED ORDER — SODIUM CHLORIDE 0.9 % IV SOLN: 250.0000 mL | INTRAVENOUS | Status: DC | PRN

## 2016-04-10 MED ORDER — ISOPROTERENOL HCL 0.2 MG/ML IJ SOLN
INTRAVENOUS | Status: DC | PRN
  Administered 2016-04-10: 20 ug/min via INTRAVENOUS

## 2016-04-10 MED ORDER — IOPAMIDOL (ISOVUE-370) INJECTION 76%
INTRAVENOUS | Status: AC
  Filled 2016-04-10: qty 50

## 2016-04-10 MED ORDER — FENTANYL CITRATE (PF) 100 MCG/2ML IJ SOLN
INTRAMUSCULAR | Status: DC | PRN
Start: 2016-04-10 — End: 2016-04-10
  Administered 2016-04-10 (×4): 25 ug via INTRAVENOUS

## 2016-04-10 MED ORDER — PROPOFOL 10 MG/ML IV BOLUS
INTRAVENOUS | Status: DC | PRN
  Administered 2016-04-10: 300 mg via INTRAVENOUS

## 2016-04-10 MED ORDER — MIDAZOLAM HCL 10 MG/2ML IJ SOLN
INTRAMUSCULAR | Status: DC | PRN
  Administered 2016-04-10 (×5): 2 mg via INTRAVENOUS

## 2016-04-10 MED ORDER — SODIUM CHLORIDE 0.9% FLUSH
3.0000 mL | Freq: Two times a day (BID) | INTRAVENOUS | Status: DC
  Administered 2016-04-10: 3 mL via INTRAVENOUS

## 2016-04-10 MED ORDER — FENTANYL CITRATE (PF) 100 MCG/2ML IJ SOLN
INTRAMUSCULAR | Status: DC | PRN
  Administered 2016-04-10 (×2): 25 ug via INTRAVENOUS
  Administered 2016-04-10 (×2): 50 ug via INTRAVENOUS

## 2016-04-10 MED ORDER — PROTAMINE SULFATE 10 MG/ML IV SOLN
INTRAVENOUS | Status: DC | PRN
  Administered 2016-04-10 (×3): 10 mg via INTRAVENOUS

## 2016-04-10 MED ORDER — BUPIVACAINE HCL (PF) 0.25 % IJ SOLN
INTRAMUSCULAR | Status: DC | PRN
  Administered 2016-04-10: 30 mL

## 2016-04-10 MED ORDER — HEPARIN SODIUM (PORCINE) 1000 UNIT/ML IJ SOLN
INTRAMUSCULAR | Status: DC | PRN
Start: 2016-04-10 — End: 2016-04-10
  Administered 2016-04-10: 1000 [IU] via INTRAVENOUS
  Administered 2016-04-10: 12000 [IU] via INTRAVENOUS
  Administered 2016-04-10: 1000 [IU] via INTRAVENOUS

## 2016-04-10 MED ORDER — MIDAZOLAM HCL 5 MG/ML IJ SOLN
INTRAMUSCULAR | Status: AC
  Filled 2016-04-10: qty 2

## 2016-04-10 MED ORDER — DIPHENHYDRAMINE HCL 50 MG/ML IJ SOLN
INTRAMUSCULAR | Status: DC | PRN
  Administered 2016-04-10: 25 mg via INTRAVENOUS

## 2016-04-10 MED ORDER — MESALAMINE 1.2 G PO TBEC
1.2000 g | DELAYED_RELEASE_TABLET | Freq: Every day | ORAL | Status: DC
  Administered 2016-04-11: 1.2 g via ORAL
  Filled 2016-04-10: qty 1

## 2016-04-10 MED ORDER — BUTAMBEN-TETRACAINE-BENZOCAINE 2-2-14 % EX AERO
INHALATION_SPRAY | CUTANEOUS | Status: DC | PRN
  Administered 2016-04-10: 2 via TOPICAL

## 2016-04-10 MED ORDER — LIDOCAINE HCL (CARDIAC) 20 MG/ML IV SOLN
INTRAVENOUS | Status: DC | PRN
  Administered 2016-04-10: 60 mg via INTRAVENOUS

## 2016-04-10 MED ORDER — SODIUM CHLORIDE 0.9% FLUSH: 3.0000 mL | INTRAVENOUS | Status: DC | PRN

## 2016-04-10 MED ORDER — ISOPROTERENOL HCL 0.2 MG/ML IJ SOLN
INTRAMUSCULAR | Status: AC
  Filled 2016-04-10: qty 5

## 2016-04-10 MED ORDER — HYDROCODONE-ACETAMINOPHEN 5-325 MG PO TABS
1.0000 | ORAL_TABLET | ORAL | Status: DC | PRN
  Administered 2016-04-10 – 2016-04-11 (×3): 2 via ORAL
  Filled 2016-04-10 (×2): qty 2

## 2016-04-10 MED ORDER — APIXABAN 5 MG PO TABS
5.0000 mg | ORAL_TABLET | Freq: Two times a day (BID) | ORAL | Status: DC
  Administered 2016-04-10 – 2016-04-11 (×2): 5 mg via ORAL
  Filled 2016-04-10 (×3): qty 1

## 2016-04-10 MED ORDER — ONDANSETRON HCL 4 MG/2ML IJ SOLN: 4.0000 mg | Freq: Four times a day (QID) | INTRAMUSCULAR | Status: DC | PRN

## 2016-04-10 MED ORDER — HEPARIN SODIUM (PORCINE) 1000 UNIT/ML IJ SOLN
INTRAMUSCULAR | Status: AC
  Filled 2016-04-10: qty 1

## 2016-04-10 MED ORDER — IOPAMIDOL (ISOVUE-370) INJECTION 76%
INTRAVENOUS | Status: DC | PRN
  Administered 2016-04-10: 5 mL via INTRAVENOUS

## 2016-04-10 MED ORDER — SODIUM CHLORIDE 0.9 % IV SOLN
INTRAVENOUS | Status: DC
  Administered 2016-04-10: 08:00:00 via INTRAVENOUS

## 2016-04-10 MED ORDER — ACETAMINOPHEN 325 MG PO TABS
650.0000 mg | ORAL_TABLET | ORAL | Status: DC | PRN
  Administered 2016-04-10: 650 mg via ORAL
  Filled 2016-04-10: qty 2

## 2016-04-10 MED ORDER — DIPHENHYDRAMINE HCL 50 MG/ML IJ SOLN
INTRAMUSCULAR | Status: AC
  Filled 2016-04-10: qty 1

## 2016-04-10 MED ORDER — LACTATED RINGERS IV SOLN
INTRAVENOUS | Status: DC | PRN
  Administered 2016-04-10: 11:00:00 via INTRAVENOUS

## 2016-04-10 MED ORDER — HYDROCODONE-ACETAMINOPHEN 5-325 MG PO TABS
ORAL_TABLET | ORAL | Status: AC
  Filled 2016-04-10: qty 2

## 2016-04-10 SURGICAL SUPPLY — 18 items
BAG SNAP BAND KOVER 36X36 (MISCELLANEOUS) ×2 IMPLANT
BLANKET WARM UNDERBOD FULL ACC (MISCELLANEOUS) ×2 IMPLANT
CATH NAVISTAR SMARTTOUCH DF (ABLATOR) ×2 IMPLANT
CATH SOUNDSTAR 3D IMAGING (CATHETERS) ×2 IMPLANT
CATH VARIABLE LASSO NAV 2515 (CATHETERS) ×2 IMPLANT
CATH WEBSTER BI DIR CS D-F CRV (CATHETERS) ×2 IMPLANT
COVER SWIFTLINK CONNECTOR (BAG) ×2 IMPLANT
NEEDLE TRANSEP BRK 71CM 407200 (NEEDLE) ×2 IMPLANT
PACK EP LATEX FREE (CUSTOM PROCEDURE TRAY) ×1
PACK EP LF (CUSTOM PROCEDURE TRAY) ×1 IMPLANT
PAD DEFIB LIFELINK (PAD) ×2 IMPLANT
PATCH CARTO3 (PAD) ×2 IMPLANT
SHEATH AVANTI 11F 11CM (SHEATH) ×2 IMPLANT
SHEATH PINNACLE 7F 10CM (SHEATH) ×4 IMPLANT
SHEATH PINNACLE 9F 10CM (SHEATH) ×2 IMPLANT
SHEATH SWARTZ TS SL2 63CM 8.5F (SHEATH) ×2 IMPLANT
SHIELD RADPAD SCOOP 12X17 (MISCELLANEOUS) ×2 IMPLANT
TUBING SMART ABLATE COOLFLOW (TUBING) ×2 IMPLANT

## 2016-04-10 NOTE — Anesthesia Preprocedure Evaluation (Addendum)
Anesthesia Evaluation  Patient identified by MRN, date of birth, ID band Patient awake    Reviewed: Allergy & Precautions, H&P , NPO status , Patient's Chart, lab work & pertinent test results  Airway Mallampati: II  TM Distance: >3 FB Neck ROM: Full    Dental no notable dental hx. (+) Teeth Intact, Dental Advisory Given   Pulmonary neg pulmonary ROS,    Pulmonary exam normal breath sounds clear to auscultation       Cardiovascular + dysrhythmias Atrial Fibrillation  Rhythm:Regular Rate:Normal     Neuro/Psych negative neurological ROS  negative psych ROS   GI/Hepatic PUD, (+)     substance abuse  ,   Endo/Other  negative endocrine ROS  Renal/GU negative Renal ROS  negative genitourinary   Musculoskeletal   Abdominal   Peds  Hematology negative hematology ROS (+)   Anesthesia Other Findings   Reproductive/Obstetrics negative OB ROS                            Anesthesia Physical Anesthesia Plan  ASA: III  Anesthesia Plan: General   Post-op Pain Management:    Induction: Intravenous  Airway Management Planned: LMA  Additional Equipment:   Intra-op Plan:   Post-operative Plan: Extubation in OR  Informed Consent: I have reviewed the patients History and Physical, chart, labs and discussed the procedure including the risks, benefits and alternatives for the proposed anesthesia with the patient or authorized representative who has indicated his/her understanding and acceptance.   Dental advisory given  Plan Discussed with: CRNA  Anesthesia Plan Comments:        Anesthesia Quick Evaluation

## 2016-04-10 NOTE — Interval H&P Note (Signed)
History and Physical Interval Note:  04/10/2016 7:38 AM  Casey Leach  has presented today for surgery, with the diagnosis of afib  The various methods of treatment have been discussed with the patient and family. After consideration of risks, benefits and other options for treatment, the patient has consented to  Procedure(s): Atrial Fibrillation Ablation (N/A) as a surgical intervention .  The patient's history has been reviewed, patient examined, no change in status, stable for surgery.  I have reviewed the patient's chart and labs.  Questions were answered to the patient's satisfaction.    He reports compliance with eliquis without interruption.  Hillis RangeJames Sharde Gover

## 2016-04-10 NOTE — Interval H&P Note (Signed)
History and Physical Interval Note:  04/10/2016 8:32 AM  Casey Leach  has presented today for surgery, with the diagnosis of AFIB  The various methods of treatment have been discussed with the patient and family. After consideration of risks, benefits and other options for treatment, the patient has consented to  Procedure(s): TRANSESOPHAGEAL ECHOCARDIOGRAM (TEE) (N/A) as a surgical intervention .  The patient's history has been reviewed, patient examined, no change in status, stable for surgery.  I have reviewed the patient's chart and labs.  Questions were answered to the patient's satisfaction.     Olga MillersBrian Crenshaw

## 2016-04-10 NOTE — Discharge Summary (Signed)
ELECTROPHYSIOLOGY PROCEDURE DISCHARGE SUMMARY    Patient ID: Casey Leach,  MRN: [id number], DOB/AGE: 13-Jun-1990 26 y.o.  Admit date: 04/10/2016 Discharge date: 04/11/16  Primary Care Physician: No PCP Per Patient  Electrophysiologist: Dr. Ladona Ridgel  Primary Discharge Diagnosis:  1. Persistent AFib     CHA2DS2Vasc is 0, on Eliquis  Secondary Discharge Diagnosis:  1. Ulcerative colitis  Procedures This Admission:  1.  Electrophysiology study and radiofrequency catheter ablation on 04/10/16 by Dr Hillis Range.  This study demonstrated   CONCLUSIONS: 1. Sinus rhythm upon presentation.   2. Intracardiac echo reveals a moderate sized left atrium with four separate pulmonary veins without evidence of pulmonary vein stenosis.    There was also a small right middle pulmonary vein as well as a large proximal branches of the right inferior PV. 3. Successful electrical isolation and anatomical encircling of all pulmonary veins with radiofrequency current.    4. Isthmus dependant right atrial flutter induced and successfully ablated today.  Cavo-tricuspid isthmus ablation was performed with complete bidirectional isthmus block achieved.  5. No inducible arrhythmias following ablation both on and off of Isuprel 6. No early apparent complications.   Brief HPI: Casey Leach is a 26 y.o. male with a history of persistent atrial fibrillation.  They have failed medical therapy with flecainide. Risks, benefits, and alternatives to catheter ablation of atrial fibrillation were reviewed with the patient who wished to proceed.  The patient underwent TEE prior to the procedure which demonstrated normal LV function and no LAA thrombus.    Hospital Course:  The patient was admitted and underwent EPS/RFCA of atrial fibrillation with details as outlined above.  He was monitored on telemetry overnight which demonstrated SR.  Groin was without complication on the day of discharge.  The patient was examined  by Dr. Johney Frame and considered to be stable for discharge.  Wound care and activity restrictions were reviewed with the patient.  The patient will be seen back by Rudi Coco, NP in 4 weeks and Dr Johney Frame in 12 weeks for post ablation follow up.      Physical Exam: Vitals:   04/10/16 1810 04/10/16 2018 04/10/16 2100 04/11/16 0445  BP: 105/62  117/69 123/82  Pulse: 70   89  Resp:    19  Temp:  98.2 F (36.8 C)  98.6 F (37 C)  TempSrc:  Oral  Oral  SpO2: 98%   98%  Weight:    193 lb 8 oz (87.8 kg)  Height:        GEN- The patient is well appearing, alert and oriented x 3 today.   HEENT: normocephalic, atraumatic; sclera clear, conjunctiva pink; hearing intact; oropharynx clear; neck supple  Lungs- CTA b/l , normal work of breathing.  No wheezes, rales, rhonchi Heart- RRR, no murmurs, rubs or gallops  GI- soft, non-tender, non-distended, bowel sounds present  Extremities- no clubbing, cyanosis, or edema; DP/PT/radial pulses 2+ bilaterally,  groin without hematoma/bruit MS- no significant deformity or atrophy Skin- warm and dry, no rash or lesion Psych- euthymic mood, full affect Neuro- strength and sensation are intact   Labs:   Lab Results  Component Value Date   WBC 4.9 04/10/2016   HGB 14.5 04/10/2016   HCT 43.2 04/10/2016   MCV 92.1 04/10/2016   PLT 186 04/10/2016   No results for input(s): NA, K, CL, CO2, BUN, CREATININE, CALCIUM, PROT, BILITOT, ALKPHOS, ALT, AST, GLUCOSE in the last 168 hours.  Invalid input(s): LABALBU   Discharge Medications:  Allergies as of 04/11/2016   No Known Allergies     Medication List    STOP taking these medications   flecainide 100 MG tablet Commonly known as:  TAMBOCOR     TAKE these medications   apixaban 5 MG Tabs tablet Commonly known as:  ELIQUIS Take 1 tablet (5 mg total) by mouth 2 (two) times daily.   loratadine 10 MG tablet Commonly known as:  CLARITIN Take 10 mg by mouth daily as needed for allergies.     mesalamine 1.2 g EC tablet Commonly known as:  LIALDA Take 1.2 g by mouth daily with breakfast.   OVER THE COUNTER MEDICATION Place 1 drop into both eyes daily as needed (DRY EYES). Rohto Dry Aid  Eye Relief   pantoprazole 40 MG tablet Commonly known as:  PROTONIX Take 1 tablet (40 mg total) by mouth daily.       Disposition: Home   Follow-up Information    [facility] Follow up on 05/08/2016.   Specialty:  Cardiology Why:  11:30AM Contact information: [address] [id number] [place] [id number] [phone]       Hillis RangeJames Nirvana Blanchett, MD Follow up on 07/09/2016.   Specialty:  Cardiology Why:  9:30AM Contact information: [address] [place] [address] [phone]           Duration of Discharge Encounter: Greater than 30 minutes including physician time.  Signed, Francis DowseRenee Ursuy, PA-C 04/11/2016 8:13 AM  I have seen, examined the patient, and reviewed the above assessment and plan. On exam, RRR. Changes to above are made where necessary.    Co Sign: Hillis RangeJames Karletta Millay, MD 04/11/2016 10:17 PM

## 2016-04-10 NOTE — Anesthesia Procedure Notes (Signed)
Procedure Name: LMA Insertion Date/Time: 04/10/2016 11:31 AM Performed by: Samara DeistBECKNER, Kathryne Ramella B Pre-anesthesia Checklist: Patient identified, Emergency Drugs available, Suction available, Patient being monitored and Timeout performed Patient Re-evaluated:Patient Re-evaluated prior to inductionOxygen Delivery Method: Circle system utilized Preoxygenation: Pre-oxygenation with 100% oxygen Intubation Type: IV induction Ventilation: Two handed mask ventilation required LMA: LMA inserted LMA Size: 5.0 Number of attempts: 2 Placement Confirmation: positive ETCO2 and breath sounds checked- equal and bilateral Tube secured with: Tape Dental Injury: Teeth and Oropharynx as per pre-operative assessment

## 2016-04-10 NOTE — Anesthesia Postprocedure Evaluation (Deleted)
Anesthesia Post Note  Patient: Casey Leach  Procedure(s) Performed: Procedure(s) (LRB): Atrial Fibrillation Ablation (N/A)  Patient location during evaluation: PACU Anesthesia Type: General Level of consciousness: awake and sedated Pain management: pain level controlled Vital Signs Assessment: post-procedure vital signs reviewed and stable Respiratory status: spontaneous breathing Cardiovascular status: stable Postop Assessment: no signs of nausea or vomiting Anesthetic complications: no        Last Vitals:  Vitals:   04/10/16 1040 04/10/16 1050  BP: 133/62 (!) 111/57  Pulse: 75 68  Resp: 15 17  Temp:      Last Pain:  Vitals:   04/10/16 0930  TempSrc: Oral   Pain Goal:                 Verline Kong JR,JOHN Sean Malinowski

## 2016-04-10 NOTE — Progress Notes (Signed)
Site area: rt groin 3 fv sheaths Site Prior to Removal:  Level 0 Pressure Applied For:  20 minutes Manual:   yes Patient Status During Pull:  stable Post Pull Site:  Level  0 Post Pull Instructions Given:  yes Post Pull Pulses Present: palpable Dressing Applied:  Gauze and tegaderm Bedrest begins @ 1525 Comments:   

## 2016-04-10 NOTE — Transfer of Care (Signed)
Immediate Anesthesia Transfer of Care Note  Patient: Casey Leach  Procedure(s) Performed: Procedure(s): Atrial Fibrillation Ablation (N/A)  Patient Location: Cath Lab  Anesthesia Type:General  Level of Consciousness: sedated  Airway & Oxygen Therapy: Patient Spontanous Breathing and Patient connected to nasal cannula oxygen  Post-op Assessment: Report given to RN and Post -op Vital signs reviewed and stable  Post vital signs: Reviewed and stable  Last Vitals:  Vitals:   04/10/16 1050 04/10/16 1447  BP: (!) 111/57   Pulse: 68   Resp: 17   Temp:  36.6 C    Last Pain:  Vitals:   04/10/16 1447  TempSrc: Temporal         Complications: No apparent anesthesia complications

## 2016-04-10 NOTE — H&P (View-Only) (Signed)
Electrophysiology Office Note   Date:  03/24/2016   ID:  Casey Leach, DOB 10/18/1990, MRN [id number]  Primary Electrophysiologist: Dr Ladona Ridgelaylor    CC: afib   History of Present Illness: Casey Leach is a 26 y.o. male who presents today for electrophysiology follow-up.   The patient has symptomatic persistent atrial fibrillation.  [facility].  He was noted to have afib at that time.  He was mostly unaware.  He was evaluated by Dr Ladona Ridgelaylor and underwent cardioversion 01/22/16.  He reports significant improvement in fatigue and exercise tolerance post cardioversion.  Unfortunately he returned to afib in 3 days.  He was again evaluated by Dr Ladona Ridgelaylor and was placed on flecainide.  He was again cardioverted 02/25/16.  He reports again feeling "much better" with sinus rhythm.  After about 3 weeks, he returned to afib.  He reports that he noticed return of afib after exercise (20 minutes in recovery phase).   He has been in afib since that time. He is anticoagulated with eliquis.  He reports compliance with eliquis without interruption.  Today, he denies symptoms of palpitations, chest pain, shortness of breath, orthopnea, PND, lower extremity edema, claudication, dizziness, presyncope, syncope, bleeding, or neurologic sequela. The patient is tolerating medications without difficulties and is otherwise without complaint today.    Past Medical History:  Diagnosis Date  . Narcotic abuse   . Persistent atrial fibrillation (HCC)   . Ulcerative colitis [facility](HCC)    Past Surgical History:  Procedure Laterality Date  . CARDIOVERSION N/A 01/22/2016   Procedure: CARDIOVERSION;  Surgeon: Lewayne BuntingBrian S Crenshaw, MD;  [facility];  Service: Cardiovascular;  Laterality: N/A;  . CARDIOVERSION N/A 02/25/2016   Procedure: CARDIOVERSION;  Surgeon: Laurey Moralealton S McLean, MD;  [facility];  Service: Cardiovascular;  Laterality: N/A;     Current  Outpatient Prescriptions  Medication Sig Dispense Refill  . apixaban (ELIQUIS) 5 MG TABS tablet Take 1 tablet (5 mg total) by mouth 2 (two) times daily. 60 tablet 6  . loratadine (CLARITIN) 10 MG tablet Take 10 mg by mouth daily.    . mesalamine (LIALDA) 1.2 g EC tablet Take 1.2 g by mouth daily with breakfast.     No current facility-administered medications for this visit.     Allergies:   Patient has no known allergies.   Social History:  The patient  reports that he has never smoked. He has never used smokeless tobacco. He reports that he uses drugs, including Marijuana. He reports that he does not drink alcohol.   Family History:  The patient's  family history includes Hypertension in his mother.   Denies any FH of afib   ROS:  Please see the history of present illness.   All other systems are reviewed and negative.    PHYSICAL EXAM: VS:  BP 132/60   Pulse 60   Ht 5\' 10"  (1.778 m)   Wt 189 lb (85.7 kg)   BMI 27.12 kg/m  , BMI Body mass index is 27.12 kg/m. GEN: Well nourished, well developed, in no acute distress  HEENT: normal  Neck: no JVD, carotid bruits, or masses Cardiac: iRRR; no murmurs, rubs, or gallops,no edema  Respiratory:  clear to auscultation bilaterally, normal work of breathing GI: soft, nontender, nondistended, + BS MS: no deformity or atrophy  Skin: warm and dry  Neuro:  Strength and sensation are intact Psych: euthymic mood, full affect  EKG:  EKG is ordered  today. The ekg ordered today shows afib, V rate 60 bpm, QTc 406 msec   Recent Labs: 01/22/2016: Hemoglobin 14.6 02/20/2016: BUN 22; Creatinine, Ser 1.06; Platelets 260; Potassium 4.4; Sodium 141    Lipid Panel  No results found for: CHOL, TRIG, HDL, CHOLHDL, VLDL, LDLCALC, LDLDIRECT   Wt Readings from Last 3 Encounters:  03/24/16 189 lb (85.7 kg)  02/20/16 187 lb 6.4 oz (85 kg)  01/22/16 185 lb (83.9 kg)      Other studies Reviewed: Additional studies/ records that were reviewed  today include: Dr Bruna Potteraylors notes, echo 01/15/16  Review of the above records today demonstrates: mild LVH, EF 55%, trivial MR, mild LA enlargement   ASSESSMENT AND PLAN:  1.  Persistent afib The patient has symptomatic afib.  He has failed medical therapy with flecainide.  He is anticoagulated with eliquis. Therapeutic strategies for afib including medicine and ablation were discussed in detail with the patient today. Risk, benefits, and alternatives to EP study and radiofrequency ablation for afib were also discussed in detail today. These risks include but are not limited to stroke, bleeding, vascular damage, tamponade, perforation, damage to the esophagus, lungs, and other structures, pulmonary vein stenosis, worsening renal function, and death. The patient understands these risk and wishes to proceed.  We will therefore proceed with catheter ablation at the next available time.  Will plan TEE prior to ablation to exclude LAA thrombus.    Current medicines are reviewed at length with the patient today.   The patient does not have concerns regarding his medicines.  The following changes were made today:  none  Labs/ tests ordered today include:  Orders Placed This Encounter  Procedures  . Basic metabolic panel  . CBC with Differential  . EKG 12-Lead     Signed, Hillis RangeJames Carrington Olazabal, MD  03/24/2016    [facility] [address] [place] [address] [phone] (office) [phone] (fax)

## 2016-04-10 NOTE — CV Procedure (Signed)
See full TEE report in [employer] Pt sedated with versed 10 mg, benadryl 25 mg and fentanyl 100 micrograms IV Normal LV systolic function No LAA thrombus Olga MillersBrian Rhyan Radler, MD

## 2016-04-10 NOTE — Progress Notes (Signed)
  Echocardiogram Echocardiogram Transesophageal has been performed.  Casey Leach 04/10/2016, 9:35 AM

## 2016-04-11 ENCOUNTER — Encounter (HOSPITAL_COMMUNITY): Payer: Self-pay | Admitting: Internal Medicine

## 2016-04-11 DIAGNOSIS — I4892 Unspecified atrial flutter: Secondary | ICD-10-CM | POA: Diagnosis not present

## 2016-04-11 DIAGNOSIS — K5190 Ulcerative colitis, unspecified, without complications: Secondary | ICD-10-CM | POA: Diagnosis not present

## 2016-04-11 DIAGNOSIS — I481 Persistent atrial fibrillation: Secondary | ICD-10-CM | POA: Diagnosis not present

## 2016-04-11 DIAGNOSIS — I480 Paroxysmal atrial fibrillation: Secondary | ICD-10-CM | POA: Diagnosis not present

## 2016-04-11 MED ORDER — PANTOPRAZOLE SODIUM 40 MG PO TBEC: 40.0000 mg | DELAYED_RELEASE_TABLET | Freq: Every day | ORAL | 0 refills | Status: AC

## 2016-04-11 NOTE — Progress Notes (Signed)
Pt in stable condition. I reviewed d/c instructions with him d/c'd via wheelchair to private vehicle

## 2016-04-11 NOTE — Discharge Instructions (Signed)
No driving until 04/14/16. No lifting over 5 lbs for 1 week. No vigorous or sexual activity for 1 week. You may return to work on 04/17/16. Keep procedure site clean & dry. If you notice increased pain, swelling, bleeding or pus, call/return!  You may shower, but no soaking baths/hot tubs/pools for 1 week.  ° °You have an appointment set up with the Atrial Fibrillation Clinic.  Multiple studies have shown that being followed by a dedicated atrial fibrillation clinic in addition to the standard care you receive from your other physicians improves health. We believe that enrollment in the atrial fibrillation clinic will allow us to better care for you.  ° °The phone number to the Atrial Fibrillation Clinic is [phone]. The clinic is staffed Monday through Friday from 8:30am to 5pm. ° °Parking Directions: The clinic is located in the Heart and Vascular Building connected to [facility]. °1)From [address] turn on to [address] and go to the 3rd entrance  (Heart and Vascular entrance) on the right. °2)Look to the right for Heart &Vascular Parking Garage. °3)A code for the entrance is 5002   °4)Take the elevators to the 1st floor. Registration is in the room with the glass walls at the end of the hallway. ° °If you have any trouble parking or locating the clinic, please don’t hesitate to call [phone]. °

## 2016-04-13 ENCOUNTER — Telehealth: Payer: Self-pay | Admitting: Cardiology

## 2016-04-13 NOTE — Telephone Encounter (Signed)
Pt with bloating,  Did improve with some BM, I asked him to eat bland food and if present in AM to call Dr. Jenel LucksAllred's nurse.  I checked with Dr. Ladona Ridgelaylor, not a usual post ablations symptom, no indigestion and he is on protonix.

## 2016-04-14 ENCOUNTER — Encounter (HOSPITAL_COMMUNITY): Payer: Self-pay | Admitting: Cardiology

## 2016-04-15 NOTE — Anesthesia Postprocedure Evaluation (Addendum)
Anesthesia Post Note  Patient: Casey Leach  Procedure(s) Performed: Procedure(s) (LRB): Atrial Fibrillation Ablation (N/A)  Patient location during evaluation: PACU Anesthesia Type: General Level of consciousness: awake Pain management: pain level controlled Vital Signs Assessment: post-procedure vital signs reviewed and stable Respiratory status: spontaneous breathing Cardiovascular status: stable Postop Assessment: no signs of nausea or vomiting Anesthetic complications: no        Last Vitals:  Vitals:   04/10/16 2100 04/11/16 0445  BP: 117/69 123/82  Pulse:  89  Resp:  19  Temp:  37 C    Last Pain:  Vitals:   04/11/16 0828  TempSrc:   PainSc: 0-No pain   Pain Goal: Patients Stated Pain Goal: 0 (04/11/16 0735)               Taiquan Campanaro JR,JOHN Susann GivensFRANKLIN

## 2016-05-02 ENCOUNTER — Other Ambulatory Visit: Payer: Self-pay | Admitting: Gastroenterology

## 2016-05-02 DIAGNOSIS — R7401 Elevation of levels of liver transaminase levels: Secondary | ICD-10-CM

## 2016-05-02 DIAGNOSIS — R740 Nonspecific elevation of levels of transaminase and lactic acid dehydrogenase [LDH]: Principal | ICD-10-CM

## 2016-05-08 ENCOUNTER — Ambulatory Visit (HOSPITAL_COMMUNITY): Payer: 59 | Admitting: Nurse Practitioner

## 2016-05-08 ENCOUNTER — Inpatient Hospital Stay (HOSPITAL_COMMUNITY): Admit: 2016-05-08 | Payer: 59 | Admitting: Nurse Practitioner

## 2016-05-08 ENCOUNTER — Telehealth (HOSPITAL_COMMUNITY): Payer: Self-pay | Admitting: *Deleted

## 2016-05-08 NOTE — Telephone Encounter (Signed)
LMOM to remind pt of missed appt today at 11:30.  Pt no-showed.  Asked pt on msg to clbk to resched.

## 2016-05-13 ENCOUNTER — Ambulatory Visit
Admission: RE | Admit: 2016-05-13 | Discharge: 2016-05-13 | Disposition: A | Discharge status: Home / Self Care | Payer: 59 | Source: Ambulatory Visit | Attending: Gastroenterology | Admitting: Gastroenterology

## 2016-05-13 DIAGNOSIS — R7401 Elevation of levels of liver transaminase levels: Secondary | ICD-10-CM

## 2016-05-13 DIAGNOSIS — R740 Nonspecific elevation of levels of transaminase and lactic acid dehydrogenase [LDH]: Principal | ICD-10-CM

## 2016-06-23 ENCOUNTER — Encounter: Payer: Self-pay | Admitting: *Deleted

## 2016-07-04 ENCOUNTER — Telehealth: Payer: Self-pay | Admitting: Internal Medicine

## 2016-07-04 NOTE — Telephone Encounter (Signed)
°  New Prob   Pt is [place] a [place] in [place] as he has moved out of state.

## 2016-07-07 NOTE — Telephone Encounter (Signed)
Discussed with Dr Johney FrameAllred and he does not know a MD in that area.  Patient will need to establish with EP and we can send our records after release is signed.

## 2016-07-09 ENCOUNTER — Ambulatory Visit: Payer: 59 | Admitting: Internal Medicine

## 2016-07-25 NOTE — Addendum Note (Signed)
Addendum  created 07/25/16 1033 by Chesnee Floren, MD   Sign clinical note
